# Patient Record
Sex: Female | Born: 1970
Health system: Southern US, Community
[De-identification: ages and names within clinical notes are randomized; demographics above are authoritative.]

## PROBLEM LIST (undated history)

## (undated) DIAGNOSIS — F419 Anxiety disorder, unspecified: Secondary | ICD-10-CM

## (undated) DIAGNOSIS — T1491XA Suicide attempt, initial encounter: Secondary | ICD-10-CM

## (undated) DIAGNOSIS — M1990 Unspecified osteoarthritis, unspecified site: Secondary | ICD-10-CM

## (undated) DIAGNOSIS — G5601 Carpal tunnel syndrome, right upper limb: Secondary | ICD-10-CM

## (undated) DIAGNOSIS — G4730 Sleep apnea, unspecified: Secondary | ICD-10-CM

## (undated) DIAGNOSIS — Z8639 Personal history of other endocrine, nutritional and metabolic disease: Secondary | ICD-10-CM

## (undated) DIAGNOSIS — K219 Gastro-esophageal reflux disease without esophagitis: Secondary | ICD-10-CM

## (undated) DIAGNOSIS — F32A Depression, unspecified: Secondary | ICD-10-CM

## (undated) DIAGNOSIS — E282 Polycystic ovarian syndrome: Secondary | ICD-10-CM

## (undated) DIAGNOSIS — Z973 Presence of spectacles and contact lenses: Secondary | ICD-10-CM

## (undated) DIAGNOSIS — G43909 Migraine, unspecified, not intractable, without status migrainosus: Secondary | ICD-10-CM

## (undated) DIAGNOSIS — Z8719 Personal history of other diseases of the digestive system: Secondary | ICD-10-CM

## (undated) HISTORY — DX: Carpal tunnel syndrome, right upper limb: G56.01

## (undated) HISTORY — PX: NASAL SEPTUM SURGERY: SHX37

## (undated) HISTORY — PX: LAPAROSCOPIC GASTRIC SLEEVE RESECTION: SHX5895

## (undated) HISTORY — PX: CHOLECYSTECTOMY: SHX55

## (undated) HISTORY — DX: Suicide attempt, initial encounter: T14.91XA

---

## 2009-02-16 DIAGNOSIS — T1491XA Suicide attempt, initial encounter: Secondary | ICD-10-CM

## 2009-02-16 HISTORY — DX: Suicide attempt, initial encounter: T14.91XA

## 2014-09-04 ENCOUNTER — Ambulatory Visit (INDEPENDENT_AMBULATORY_CARE_PROVIDER_SITE_OTHER): Payer: BLUE CROSS/BLUE SHIELD | Admitting: Family Medicine

## 2014-09-04 ENCOUNTER — Encounter: Payer: Self-pay | Admitting: Family Medicine

## 2014-09-04 VITALS — BP 127/80 | HR 86 | Ht 61.0 in | Wt 242.0 lb

## 2014-09-04 DIAGNOSIS — G5601 Carpal tunnel syndrome, right upper limb: Secondary | ICD-10-CM | POA: Diagnosis not present

## 2014-09-04 DIAGNOSIS — Z9989 Dependence on other enabling machines and devices: Secondary | ICD-10-CM

## 2014-09-04 DIAGNOSIS — Z6841 Body Mass Index (BMI) 40.0 and over, adult: Secondary | ICD-10-CM

## 2014-09-04 DIAGNOSIS — E282 Polycystic ovarian syndrome: Secondary | ICD-10-CM

## 2014-09-04 DIAGNOSIS — G4733 Obstructive sleep apnea (adult) (pediatric): Secondary | ICD-10-CM | POA: Insufficient documentation

## 2014-09-04 DIAGNOSIS — M25561 Pain in right knee: Secondary | ICD-10-CM

## 2014-09-04 NOTE — Patient Instructions (Addendum)
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    Polycystic Ovarian Syndrome Polycystic ovarian syndrome (PCOS) is a common hormonal disorder among women of reproductive age. Most women with PCOS grow many small cysts on their ovaries. PCOS can cause problems with your periods and make it difficult to get pregnant. It can also cause an increased risk of miscarriage with pregnancy. If left untreated, PCOS can lead to serious health problems, such as diabetes and heart disease. CAUSES The cause of PCOS is not fully understood, but genetics may be a factor. SIGNS AND SYMPTOMS   Infrequent or no menstrual periods.   Inability to get pregnant (infertility) because of not ovulating.   Increased growth of hair on the face, chest, stomach, back, thumbs, thighs, or toes.   Acne, oily skin, or dandruff.   Pelvic pain.   Weight gain or obesity, usually carrying extra weight around the waist.   Type 2 diabetes.   High cholesterol.   High blood pressure.   Female-pattern baldness or thinning hair.   Patches of thickened and dark brown or black skin on the neck, arms, breasts, or thighs.   Tiny excess flaps of skin (skin tags) in the armpits or neck area.   Excessive snoring and having breathing stop at times while asleep (sleep apnea).   Deepening of the voice.   Gestational diabetes when pregnant.  DIAGNOSIS  There is no single test to diagnose PCOS.   Your health care provider will:   Take a medical history.   Perform a pelvic exam.   Have ultrasonography done.   Check your female and female hormone levels.   Measure glucose or sugar levels in the blood.   Do other blood tests.   If you are producing too many female hormones, your health care provider will make sure it is from PCOS. At the physical exam, your health care provider will want to evaluate the areas of increased hair growth. Try to allow natural hair growth for a few days before  the visit.   During a pelvic exam, the ovaries may be enlarged or swollen because of the increased number of small cysts. This can be seen more easily by using vaginal ultrasonography or screening to examine the ovaries and lining of the uterus (endometrium) for cysts. The uterine lining may become thicker if you have not been having a regular period.  TREATMENT  Because there is no cure for PCOS, it needs to be managed to prevent problems. Treatments are based on your symptoms. Treatment is also based on whether you want to have a baby or whether you need contraception.  Treatment may include:   Progesterone hormone to start a menstrual period.   Birth control pills to make you have regular menstrual periods.   Medicines to make you ovulate, if you want to get pregnant.   Medicines to control your insulin.   Medicine to control your blood pressure.   Medicine and diet to control your high cholesterol and triglycerides in your blood.  Medicine to reduce excessive hair growth.  Surgery, making small holes in the ovary, to decrease the amount of female hormone production. This is done through a long, lighted tube (laparoscope) placed into the pelvis through a tiny incision in the lower abdomen.  HOME CARE INSTRUCTIONS  Only take over-the-counter or prescription medicine as directed by your health care provider.  Pay attention to the foods you eat and your activity levels. This can help reduce the  effects of PCOS.  Keep your weight under control.  Eat foods that are low in carbohydrate and high in fiber.  Exercise regularly. SEEK MEDICAL CARE IF:  Your symptoms do not get better with medicine.  You have new symptoms. Document Released: 05/29/2004 Document Revised: 11/23/2012 Document Reviewed: 07/21/2012 [facility] Patient Information 2015 [place], [place]. This information is not intended to replace advice given to you by your health care provider. Make sure you discuss any  questions you have with your health care provider. Polycystic Ovarian Syndrome Polycystic ovarian syndrome (PCOS) is a common hormonal disorder among women of reproductive age. Most women with PCOS grow many small cysts on their ovaries. PCOS can cause problems with your periods and make it difficult to get pregnant. It can also cause an increased risk of miscarriage with pregnancy. If left untreated, PCOS can lead to serious health problems, such as diabetes and heart disease. CAUSES The cause of PCOS is not fully understood, but genetics may be a factor. SIGNS AND SYMPTOMS   Infrequent or no menstrual periods.   Inability to get pregnant (infertility) because of not ovulating.   Increased growth of hair on the face, chest, stomach, back, thumbs, thighs, or toes.   Acne, oily skin, or dandruff.   Pelvic pain.   Weight gain or obesity, usually carrying extra weight around the waist.   Type 2 diabetes.   High cholesterol.   High blood pressure.   Female-pattern baldness or thinning hair.   Patches of thickened and dark brown or black skin on the neck, arms, breasts, or thighs.   Tiny excess flaps of skin (skin tags) in the armpits or neck area.   Excessive snoring and having breathing stop at times while asleep (sleep apnea).   Deepening of the voice.   Gestational diabetes when pregnant.  DIAGNOSIS  There is no single test to diagnose PCOS.   Your health care provider will:   Take a medical history.   Perform a pelvic exam.   Have ultrasonography done.   Check your female and female hormone levels.   Measure glucose or sugar levels in the blood.   Do other blood tests.   If you are producing too many female hormones, your health care provider will make sure it is from PCOS. At the physical exam, your health care provider will want to evaluate the areas of increased hair growth. Try to allow natural hair growth for a few days before the visit.    During a pelvic exam, the ovaries may be enlarged or swollen because of the increased number of small cysts. This can be seen more easily by using vaginal ultrasonography or screening to examine the ovaries and lining of the uterus (endometrium) for cysts. The uterine lining may become thicker if you have not been having a regular period.  TREATMENT  Because there is no cure for PCOS, it needs to be managed to prevent problems. Treatments are based on your symptoms. Treatment is also based on whether you want to have a baby or whether you need contraception.  Treatment may include:   Progesterone hormone to start a menstrual period.   Birth control pills to make you have regular menstrual periods.   Medicines to make you ovulate, if you want to get pregnant.   Medicines to control your insulin.   Medicine to control your blood pressure.   Medicine and diet to control your high cholesterol and triglycerides in your blood.  Medicine to reduce excessive hair  growth.  Surgery, making small holes in the ovary, to decrease the amount of female hormone production. This is done through a long, lighted tube (laparoscope) placed into the pelvis through a tiny incision in the lower abdomen.  HOME CARE INSTRUCTIONS  Only take over-the-counter or prescription medicine as directed by your health care provider.  Pay attention to the foods you eat and your activity levels. This can help reduce the effects of PCOS.  Keep your weight under control.  Eat foods that are low in carbohydrate and high in fiber.  Exercise regularly. SEEK MEDICAL CARE IF:  Your symptoms do not get better with medicine.  You have new symptoms. Document Released: 05/29/2004 Document Revised: 11/23/2012 Document Reviewed: 07/21/2012 [facility] Patient Information 2015 [place], [place]. This information is not intended to replace advice given to you by your health care provider. Make sure you discuss any questions  you have with your health care provider.

## 2014-09-04 NOTE — Progress Notes (Signed)
Subjective:    Patient ID: Tonya Phillips, female    DOB: [birth year], 44 y.o.   MRN: [id number]  HPI 44 year old female comes in today to establish care. Her husband is already a patient here. She has a hx of PCOS and would like to discuss her weight. She says she has the classic symptoms including abnormal hair growth, weight gain and difficulty losing weight and irregular cycles.  She has recently joined the gym and she has scheduled an appointment with a nutritionist coming up.  Has never taken weight loss medications.      Moved her 5 years ago.   Dx with OSA about 15 years ago.  She's just been buying supplies off the Internet and has not been established with a local home health company to get her DME supplies for her CPAP. She says she's not really sure what the machine is set on. But she does use it regularly.  She was referred here by her brother.  Has had carpal tunnel in right wrist x 1 year. 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  If holding a pen or driving or brushing her hand it start to tingle and go numb nad pain radiates into the right forearm.    She's also had some right knee pain. She said she was diagnosed with some arthritis in that knee about a year ago. She notices a lot of discomfort when she first gets up out of the chair. She wants to know what the best thing over-the-counter to take for pain relief.  Review of Systems  Constitutional: Negative for fever, diaphoresis and unexpected weight change.  HENT: Negative for hearing loss, rhinorrhea and tinnitus.   Eyes: Negative for visual disturbance.  Respiratory: Negative for cough and wheezing.   Cardiovascular: Negative for chest pain and palpitations.  Gastrointestinal: Negative for nausea, vomiting, diarrhea and blood in stool.  Genitourinary: Negative for vaginal bleeding, vaginal discharge and difficulty urinating.  Musculoskeletal: Negative for myalgias and arthralgias.  Skin: Negative  for rash.  Neurological: Negative for headaches.  Hematological: Negative for adenopathy. Does not bruise/bleed easily.  Psychiatric/Behavioral: Negative for sleep disturbance and dysphoric mood. The patient is not nervous/anxious.      BP 127/80 mmHg  Pulse 86  Ht 5\' 1"  (1.549 m)  Wt 242 lb (109.77 kg)  BMI 45.75 kg/m2  LMP 08/15/2014 (Exact Date)    No Known Allergies  History reviewed. No pertinent past medical history.  Past Surgical History  Procedure Laterality Date  . Cholecystectomy    . Nasal septum surgery      History   Social History  . Marital Status: Divorced    Spouse Name: N/A  . Number of Children: 0  . Years of Education: college   Occupational History  . 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   - degree in accounting.    Social History Main Topics  . Smoking status: Never Smoker   . Smokeless tobacco: Not on file  . Alcohol Use: 0.0 oz/week    0 Standard drinks or equivalent per week     Comment: occas  . Drug Use: No  . Sexual Activity: Not Currently   Other Topics Concern  . Not on file   Social History Narrative   Pt is a Landscape architect. She has moved to [place] from [place]. Her parents live with her.    Family History  Problem Relation Age of Onset  .  Alcoholism Paternal Grandfather   . Hypertension Brother     Outpatient Encounter Prescriptions as of 09/04/2014  Medication Sig  . venlafaxine XR (EFFEXOR-XR) 150 MG 24 hr capsule    No facility-administered encounter medications on file as of 09/04/2014.           Objective:   Physical Exam  Constitutional: She is oriented to person, place, and time. She appears well-developed and well-nourished.  HENT:  Head: Normocephalic and atraumatic.  Right Ear: External ear normal.  Left Ear: External ear normal.  Nose: Nose normal.  Eyes: Conjunctivae and EOM are normal. Pupils are equal, round, and reactive to light.  Neck: Neck supple. No thyromegaly present.  Cardiovascular:  Normal rate, regular rhythm and normal heart sounds.   Pulmonary/Chest: Effort normal and breath sounds normal. She has no wheezes.  Musculoskeletal: She exhibits no edema.  Lymphadenopathy:    She has no cervical adenopathy.  Neurological: She is alert and oriented to person, place, and time.  Skin: Skin is warm and dry.  Psychiatric: She has a normal mood and affect. Her behavior is normal.          Assessment & Plan:  PCOS - discussed could consider restarting metformin. Also encouraged her to get back on track with diet and exercise. She is Re: Made some great steps forward in joining the gym and has her to schedule a nutrition visit. Also recommended the smart phone application called my fitness pal to help her set some caloric goals. I also gave her an article about using metformin in PCO S and encouraged her to read and think about getting back on the medication. It may even help her lose weight.  Sleep apnea - Will need to get copy of sleep study.  Will schedule her with the home for her supplies. Should she moved here she's just been using the Internet to get her supplies.  Right carpal tunnel - Discussed ergonomics for work statin.  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 Continue to wear night splints. If she's not improving then she can see one of our sports medicine physicians for an injection around the median nerve.  Right knee pain/osteoporosis-I handout on patellofemoral syndrome encouraged her to do some stretches on her on home. Also recommend Aleve over-the-counter for pain relief and/or Tylenol arthritis.  BMI 45- see note under PCOS.

## 2014-09-19 ENCOUNTER — Encounter: Payer: Self-pay | Admitting: Family Medicine

## 2014-09-19 DIAGNOSIS — M1711 Unilateral primary osteoarthritis, right knee: Secondary | ICD-10-CM | POA: Insufficient documentation

## 2014-09-19 DIAGNOSIS — G4730 Sleep apnea, unspecified: Secondary | ICD-10-CM | POA: Insufficient documentation

## 2014-09-19 DIAGNOSIS — J310 Chronic rhinitis: Secondary | ICD-10-CM | POA: Insufficient documentation

## 2014-10-02 ENCOUNTER — Encounter: Payer: Self-pay | Admitting: Family Medicine

## 2014-10-02 ENCOUNTER — Ambulatory Visit (INDEPENDENT_AMBULATORY_CARE_PROVIDER_SITE_OTHER): Payer: BLUE CROSS/BLUE SHIELD | Admitting: Family Medicine

## 2014-10-02 ENCOUNTER — Ambulatory Visit (INDEPENDENT_AMBULATORY_CARE_PROVIDER_SITE_OTHER): Payer: BLUE CROSS/BLUE SHIELD

## 2014-10-02 DIAGNOSIS — M25461 Effusion, right knee: Secondary | ICD-10-CM

## 2014-10-02 DIAGNOSIS — M7731 Calcaneal spur, right foot: Secondary | ICD-10-CM | POA: Diagnosis not present

## 2014-10-02 DIAGNOSIS — M25571 Pain in right ankle and joints of right foot: Secondary | ICD-10-CM | POA: Insufficient documentation

## 2014-10-02 DIAGNOSIS — M79671 Pain in right foot: Secondary | ICD-10-CM

## 2014-10-02 DIAGNOSIS — G5601 Carpal tunnel syndrome, right upper limb: Secondary | ICD-10-CM

## 2014-10-02 DIAGNOSIS — G5600 Carpal tunnel syndrome, unspecified upper limb: Secondary | ICD-10-CM | POA: Insufficient documentation

## 2014-10-02 NOTE — Patient Instructions (Signed)
Thank you for coming in today. Return in a few weeks for combined follow-up foot and carpal tunnel injection. Request a 30 minute visit. Take up to 2 Aleve twice daily for pain. We'll call you with the results from labs and x-rays today Use the postoperative shoe for pain.  Metatarsal Stress Fracture When too much stress is put on the foot, as in running and jumping sports, the center shaft of the bones of the forefoot is very susceptible to stress fractures (break in bone). This is because of repetitive stress on the bone. This injury is more common if osteoporosis is present or if inadequate running shoes are used. Rapid increase in running distances are often the cause. Running distances should be gradually increased to avoid this problem. Shoes should be used which adequately cushion the foot. Shoes should absorb the shocks of the activity.  DIAGNOSIS  Usually the diagnosis is made by history. The foot progressively becomes sorer with activities. X-rays may be negative (show no break) within the first 2 to 3 weeks of the beginning of pain. A later X-ray may show signs of healing bone (callus formation). A bone scan or MRI will usually make the diagnosis earlier. TREATMENT AND HOME CARE INSTRUCTIONS  Treatment may or may not include a cast, removable fracture boot, or walking shoe. Casts are used for short periods of time to prevent muscle atrophy (muscle wasting).  Activities should be stopped until further advised by your caregiver.  Wear shoes with adequate shock absorbing abilities.  Alternative exercise may be undertaken while waiting for healing. These may include bicycling and swimming, or as your caregiver suggests. If you do not have a cast or splint:  You may walk on your injured foot as tolerated or advised.  Do not put any weight on your injured foot for as long as directed by your caregiver. Slowly increase the amount of time you walk on the foot as the pain allows or as  advised.  Use crutches until you can bear weight without pain. A gradual increase in weight bearing may help.  Apply ice to the injury for 15-20 minutes each hour while awake for the first 2 days. Put the ice in a plastic bag and place a towel between the bag of ice and your skin.  Only take over-the-counter or prescription medicines for pain, discomfort, or fever as directed by your caregiver. SEEK IMMEDIATE MEDICAL CARE IF:   Pain is becoming worse rather than better, or if pain is uncontrolled with medications.  You have increased swelling or redness in the foot. MAKE SURE YOU:   Understand these instructions.  Will watch your condition.  Will get help right away if you are not doing well or get worse. Document Released: 01/31/2000 Document Revised: 04/27/2011 Document Reviewed: 11/29/2007 [facility] Patient Information 2015 [place], [place]. This information is not intended to replace advice given to you by your health care provider. Make sure you discuss any questions you have with your health care provider.

## 2014-10-02 NOTE — Progress Notes (Signed)
   Subjective:    I'm seeing this patient as a consultation for:  Dr. Madilyn Fireman  CC: Right foot pain  HPI: Patient notes about one week history of right foot pain. She has pain and swelling in the lateral foot and ankle. She notes that she's increased her activity over the last 3 weeks and attempt to be more healthy. She denies any injury. She's tried ice and over-the-counter medicines for pain which helps some. No radiating pain weakness or numbness fevers or chills.  Additionally patient notes continued right-sided carpal tunnel syndrome. She was diagnosed with a nerve conduction study and outside office in the past. She sees night splints which helps some however she is having some now persistent tingling and pain into the radial 3 fingers and thumb on her right side. This is worse with activity. She denies any weakness.  Past medical history, Surgical history, Family history not pertinant except as noted below, Social history, Allergies, and medications have been entered into the medical record, reviewed, and no changes needed.   Review of Systems: No headache, visual changes, nausea, vomiting, diarrhea, constipation, dizziness, abdominal pain, skin rash, fevers, chills, night sweats, weight loss, swollen lymph nodes, body aches, joint swelling, muscle aches, chest pain, shortness of breath, mood changes, visual or auditory hallucinations.   Objective:    Filed Vitals:   10/02/14 1538  BP: 140/87  Pulse: 84   General: Well Developed, well nourished, and in no acute distress.  Neuro/Psych: Alert and oriented x3, extra-ocular muscles intact, able to move all 4 extremities, sensation grossly intact. Skin: Warm and dry, no rashes noted.  Respiratory: Not using accessory muscles, speaking in full sentences, trachea midline.  Cardiovascular: Pulses palpable, no extremity edema. Abdomen: Does not appear distended. MSK: MSK:  Right leg: Knee nontender normal motion Ankle mildly swollen and  tender lateral malleolus. Stable ligamentous exam. Pain with motion Foot swollen and tender lateral foot especially fifth metatarsal distally. Normal appearance. Normal pulses capillary refill and sensation.  Right hand normal-appearing nontender. Positive Tinel's and Phalen's test. No thenar atrophy. Normal grip strength pulses capillary refill sensation  No results found for this or any previous visit (from the past 24 hour(s)). No results found.  Impression and Recommendations:   This case required medical decision making of moderate complexity.

## 2014-10-02 NOTE — Assessment & Plan Note (Signed)
Stress fracture of the fifth metatarsal versus gout versus OA. X-ray pending of the foot and ankle. Discussed options. Trial of naproxen, postoperative shoe and relative rest. Return in a few weeks if not better.

## 2014-10-02 NOTE — Assessment & Plan Note (Signed)
Patient's symptoms are entirely consistent with carpal tunnel syndrome. She reportedly has a nerve conduction study that confirmed the diagnosis. At this point she has tried conservative measures and still has breakthrough symptoms. She is interested in injection. Patient will schedule in a few weeks for ultrasound guided hydrodissection of the median nerve.

## 2014-10-03 ENCOUNTER — Ambulatory Visit: Payer: BLUE CROSS/BLUE SHIELD | Admitting: Family Medicine

## 2014-10-03 LAB — CBC
HCT: 40.9 % (ref 36.0–46.0)
HEMOGLOBIN: 13.6 g/dL (ref 12.0–15.0)
MCH: 28.8 pg (ref 26.0–34.0)
MCHC: 33.3 g/dL (ref 30.0–36.0)
MCV: 86.5 fL (ref 78.0–100.0)
MPV: 10 fL (ref 8.6–12.4)
Platelets: 259 10*3/uL (ref 150–400)
RBC: 4.73 MIL/uL (ref 3.87–5.11)
RDW: 14.6 % (ref 11.5–15.5)
WBC: 9.2 10*3/uL (ref 4.0–10.5)

## 2014-10-03 LAB — COMPLETE METABOLIC PANEL WITH GFR
ALBUMIN: 4.3 g/dL (ref 3.6–5.1)
ALK PHOS: 63 U/L (ref 33–115)
ALT: 29 U/L (ref 6–29)
AST: 23 U/L (ref 10–30)
BUN: 12 mg/dL (ref 7–25)
CO2: 29 mmol/L (ref 20–31)
Calcium: 9.6 mg/dL (ref 8.6–10.2)
Chloride: 103 mmol/L (ref 98–110)
Creat: 0.73 mg/dL (ref 0.50–1.10)
GFR, Est African American: >89 mL/min (ref >=60)
GFR, Est Non African American: >89 mL/min (ref >=60)
GLUCOSE: 108 mg/dL — AB (ref 65–99)
POTASSIUM: 4.2 mmol/L (ref 3.5–5.3)
Sodium: 144 mmol/L (ref 135–146)
Total Bilirubin: 0.3 mg/dL (ref 0.2–1.2)
Total Protein: 7.6 g/dL (ref 6.1–8.1)

## 2014-10-03 LAB — URIC ACID: URIC ACID, SERUM: 6.1 mg/dL (ref 2.4–7.0)

## 2014-10-04 NOTE — Progress Notes (Signed)
Quick Note:  Xray shows no fracture ______

## 2014-10-15 ENCOUNTER — Ambulatory Visit (INDEPENDENT_AMBULATORY_CARE_PROVIDER_SITE_OTHER): Payer: BLUE CROSS/BLUE SHIELD | Admitting: Family Medicine

## 2014-10-15 ENCOUNTER — Encounter: Payer: Self-pay | Admitting: Family Medicine

## 2014-10-15 VITALS — BP 134/83 | HR 81 | Wt 243.0 lb

## 2014-10-15 DIAGNOSIS — M25571 Pain in right ankle and joints of right foot: Secondary | ICD-10-CM

## 2014-10-15 DIAGNOSIS — G5601 Carpal tunnel syndrome, right upper limb: Secondary | ICD-10-CM | POA: Diagnosis not present

## 2014-10-15 NOTE — Assessment & Plan Note (Signed)
Status post carpal median nerve tunnel hydrodissection. Plan for wrist brace for a few weeks and follow-up if not better. If no improvement would recommend referral to surgery

## 2014-10-15 NOTE — Assessment & Plan Note (Signed)
Patient has pain and evidence of DJD on x-ray. Plan for body helix compression garment. Return in a few weeks if not better. At that time would consider steroid injection versus MRI.

## 2014-10-15 NOTE — Progress Notes (Signed)
Tonya Phillips is a 44 y.o. female who presents to [place]: [facility]  today for follow-up right ankle and right carpal tunnel.  1) right ankle: Patient was seen a few weeks ago foot and ankle pain. X-rays showed some arthritis without fracture. She notes the metatarsal pain has significantly improved however she continues to have significant anterior lateral ankle pain. Pain is worse following activity and is associated with swelling. She's used some ankle braces in the past which helps some. No radiating pain weakness or numbness. No recent injury.  2) wrist: Patient notes continued carpal tunnel syndrome. She has tried multiple conservative methods which have shown to be quite bothersome. She notes persistent numbness and tingling into her first 3 digits of her right hand. We discussed possibility of carpal tunnel median nerve hydrodissection at the last visit and she would like to proceed with this today.   Past Medical History  Diagnosis Date  . Suicide attempt 2011  . Right carpal tunnel syndrome     EMG scanned in   Past Surgical History  Procedure Laterality Date  . Cholecystectomy    . Nasal septum surgery     Social History  Substance Use Topics  . Smoking status: Never Smoker   . Smokeless tobacco: Not on file  . Alcohol Use: 0.0 oz/week    0 Standard drinks or equivalent per week     Comment: occas   family history includes Alcoholism in her paternal grandfather; Hypertension in her brother.  ROS as above Medications: Current Outpatient Prescriptions  Medication Sig Dispense Refill  . venlafaxine XR (EFFEXOR-XR) 150 MG 24 hr capsule      No current facility-administered medications for this visit.   No Known Allergies   Exam:  BP 134/83 mmHg  Pulse 81  Wt 243 lb (110.224 kg) Gen: Well NAD HEENT: EOMI,  MMM Right ankle normal-appearing no significant swelling. Tender palpation anterior lateral aspect of the ankle. Pain with talar tilt  however no significant laxity with talar tilt or anterior drawer. Pulses capillary refill sensation and strength are intact Right hand normal-appearing positive Tinel's and Phalen's test. Normal pulses capillary refill sensation strength and motion. Exts: Brisk capillary refill, warm and well perfused.   Procedure: Real-time Ultrasound Guided right carpal tunnel median nerve hydrodissection  Device: GE Logiq E  Images permanently stored and available for review in the ultrasound unit. Verbal informed consent obtained. Discussed risks and benefits of procedure. Warned about infection bleeding damage to structures skin hypopigmentation, fat atrophy, and nerve injury among others. Patient expresses understanding and agreement Time-out conducted.  Noted no overlying erythema, induration, or other signs of local infection.  Skin prepped in a sterile fashion.  Local anesthesia: Topical Ethyl chloride.  With sterile technique and under real time ultrasound guidance: 40mg  Kenalog and 4 mL lidocaine without epinephrine injected carefully superficial and deep to the median nerve.  Halfway through the procedure the needle was repositioned to allow better approach to the superficial portion of the nerve. Care was taken to avoid the ulnar artery. Completed without difficulty  Advised to call if fevers/chills, erythema, induration, drainage, or persistent bleeding.  Images permanently stored and available for review in the ultrasound unit.  Impression: Technically successful ultrasound guided injection.    No results found for this or any previous visit (from the past 24 hour(s)). No results found.   Please see individual assessment and plan sections.

## 2014-10-15 NOTE — Patient Instructions (Signed)
Thank you for coming in today. Call or go to the ER if you develop a large red swollen joint with extreme pain or oozing puss.  Use the wrist brace for at least 1 week.  Get a BodyHelix ankle brace and use during and for 1 hour after activity.  Return in a few weeks to follow up your ankle.   Carpal Tunnel Syndrome The carpal tunnel is a narrow area located on the palm side of your wrist. The tunnel is formed by the wrist bones and ligaments. Nerves, blood vessels, and tendons pass through the carpal tunnel. Repeated wrist motion or certain diseases may cause swelling within the tunnel. This swelling pinches the main nerve in the wrist (median nerve) and causes the painful hand and arm condition called carpal tunnel syndrome. CAUSES   Repeated wrist motions.  Wrist injuries.  Certain diseases like arthritis, diabetes, alcoholism, hyperthyroidism, and kidney failure.  Obesity.  Pregnancy. SYMPTOMS   A "pins and needles" feeling in your fingers or hand, especially in your thumb, index and middle fingers.  Tingling or numbness in your fingers or hand.  An aching feeling in your entire arm, especially when your wrist and elbow are bent for long periods of time.  Wrist pain that goes up your arm to your shoulder.  Pain that goes down into your palm or fingers.  A weak feeling in your hands. DIAGNOSIS  Your health care provider will take your history and perform a physical exam. An electromyography test may be needed. This test measures electrical signals sent out by your nerves into the muscles. The electrical signals are usually slowed by carpal tunnel syndrome. You may also need X-rays. TREATMENT  Carpal tunnel syndrome may clear up by itself. Your health care provider may recommend a wrist splint or medicine such as a nonsteroidal anti-inflammatory medicine. Cortisone injections may help. Sometimes, surgery may be needed to free the pinched nerve.  HOME CARE INSTRUCTIONS   Take  all medicine as directed by your health care provider. Only take over-the-counter or prescription medicines for pain, discomfort, or fever as directed by your health care provider.  If you were given a splint to keep your wrist from bending, wear it as directed. It is important to wear the splint at night. Wear the splint for as long as you have pain or numbness in your hand, arm, or wrist. This may take 1 to 2 months.  Rest your wrist from any activity that may be causing your pain. If your symptoms are work-related, you may need to talk to your employer about changing to a job that does not require using your wrist.  Put ice on your wrist after long periods of wrist activity.  Put ice in a plastic bag.  Place a towel between your skin and the bag.  Leave the ice on for 15-20 minutes, 03-04 times a day.  Keep all follow-up visits as directed by your health care provider. This includes any orthopedic referrals, physical therapy, and rehabilitation. Any delay in getting necessary care could result in a delay or failure of your condition to heal. SEEK IMMEDIATE MEDICAL CARE IF:   You have new, unexplained symptoms.  Your symptoms get worse and are not helped or controlled with medicines. MAKE SURE YOU:   Understand these instructions.  Will watch your condition.  Will get help right away if you are not doing well or get worse. Document Released: 01/31/2000 Document Revised: 06/19/2013 Document Reviewed: 12/19/2010 ExitCare Patient Information 2015  [employer]. This information is not intended to replace advice given to you by your health care provider. Make sure you discuss any questions you have with your health care provider.

## 2014-11-01 ENCOUNTER — Ambulatory Visit: Payer: BLUE CROSS/BLUE SHIELD | Admitting: Family Medicine

## 2014-11-07 ENCOUNTER — Ambulatory Visit: Payer: BLUE CROSS/BLUE SHIELD | Admitting: Family Medicine

## 2015-02-08 ENCOUNTER — Ambulatory Visit (INDEPENDENT_AMBULATORY_CARE_PROVIDER_SITE_OTHER): Payer: BLUE CROSS/BLUE SHIELD | Admitting: Family Medicine

## 2015-02-08 ENCOUNTER — Encounter: Payer: Self-pay | Admitting: Family Medicine

## 2015-02-08 VITALS — BP 150/79 | HR 80 | Wt 251.0 lb

## 2015-02-08 DIAGNOSIS — M25531 Pain in right wrist: Secondary | ICD-10-CM | POA: Diagnosis not present

## 2015-02-08 DIAGNOSIS — G5601 Carpal tunnel syndrome, right upper limb: Secondary | ICD-10-CM | POA: Diagnosis not present

## 2015-02-08 MED ORDER — NAPROXEN 375 MG PO TABS: 375.0000 mg | ORAL_TABLET | Freq: Two times a day (BID) | ORAL | Status: DC

## 2015-02-08 MED ORDER — DICLOFENAC SODIUM 1 % TD GEL: 2.0000 g | Freq: Four times a day (QID) | TRANSDERMAL | Status: DC

## 2015-02-08 NOTE — Patient Instructions (Signed)
Thank you for coming in today. Use the brace.  Take oral naproxen.  Apply topical neck gel. Return in a few weeks if not better.

## 2015-02-08 NOTE — Assessment & Plan Note (Signed)
Possibly due to tendinitis. Oral and topical NSAIDs bracing and rest. Return if not better.

## 2015-02-08 NOTE — Assessment & Plan Note (Signed)
Possible mild recurrence. Trial of oral and topical NSAIDs with bracing and rest. Return if not improved will consider repeat injection.

## 2015-02-08 NOTE — Progress Notes (Signed)
       Tonya Phillips is a 44 y.o. female who presents to [facility] today for right wrist pain. Patient received a carpal tunnel injection in August 2016. This worked well until recently. She notes a one-week history of mild numbness and tingling to the first 3 digits of her right hand and developed volar wrist pain yesterday. She denies any injury. She has not had much treatment unit. No fevers chills nausea vomiting or diarrhea. She feels well otherwise.   Past Medical History  Diagnosis Date  . Suicide attempt ([place]) 2011  . Right carpal tunnel syndrome     EMG scanned in   Past Surgical History  Procedure Laterality Date  . Cholecystectomy    . Nasal septum surgery     Social History  Substance Use Topics  . Smoking status: Never Smoker   . Smokeless tobacco: Not on file  . Alcohol Use: 0.0 oz/week    0 Standard drinks or equivalent per week     Comment: occas   family history includes Alcoholism in her paternal grandfather; Hypertension in her brother.  ROS as above Medications: Current Outpatient Prescriptions  Medication Sig Dispense Refill  . venlafaxine XR (EFFEXOR-XR) 150 MG 24 hr capsule     . diclofenac sodium (VOLTAREN) 1 % GEL Apply 2 g topically 4 (four) times daily. 100 g 12  . naproxen (NAPROSYN) 375 MG tablet Take 1 tablet (375 mg total) by mouth 2 (two) times daily with a meal. 30 tablet 0   No current facility-administered medications for this visit.   No Known Allergies   Exam:  BP 150/79 mmHg  Pulse 80  Wt 251 lb (113.853 kg) Gen: Well NAD Right wrist is normal appearing and nontender. Normal motion. Mildly positive Tinel's. Normal grip strength pulses capillary refill and sensation.  Nerve conduction study of the right wrist 2009 reviewed  No results found for this or any previous visit (from the past 24 hour(s)). No results found.   Please see  individual assessment and plan sections.

## 2015-02-18 ENCOUNTER — Encounter: Payer: Self-pay | Admitting: Family Medicine

## 2015-02-19 MED ORDER — METFORMIN HCL 500 MG PO TABS: 500.0000 mg | ORAL_TABLET | Freq: Two times a day (BID) | ORAL | Status: DC

## 2015-03-10 ENCOUNTER — Encounter: Payer: Self-pay | Admitting: Family Medicine

## 2015-03-11 ENCOUNTER — Other Ambulatory Visit: Payer: Self-pay

## 2015-03-11 NOTE — Telephone Encounter (Signed)
Patient wants a refill on venlafaxine. Historical medication. She has not been seen since 08/2014. She has 1 tablet left.

## 2015-03-12 ENCOUNTER — Other Ambulatory Visit: Payer: Self-pay | Admitting: Family Medicine

## 2015-03-13 NOTE — Telephone Encounter (Signed)
Ok to fill for 30 days but does need a f/u appt

## 2015-03-13 NOTE — Telephone Encounter (Signed)
Please advise 

## 2015-03-20 MED ORDER — VENLAFAXINE HCL ER 150 MG PO CP24: 150.0000 mg | ORAL_CAPSULE | Freq: Every day | ORAL | Status: DC

## 2015-05-08 ENCOUNTER — Encounter: Payer: Self-pay | Admitting: Family Medicine

## 2015-05-08 ENCOUNTER — Ambulatory Visit (INDEPENDENT_AMBULATORY_CARE_PROVIDER_SITE_OTHER): Payer: BLUE CROSS/BLUE SHIELD | Admitting: Family Medicine

## 2015-05-08 VITALS — BP 123/63 | HR 70 | Wt 240.0 lb

## 2015-05-08 DIAGNOSIS — E282 Polycystic ovarian syndrome: Secondary | ICD-10-CM | POA: Diagnosis not present

## 2015-05-08 LAB — POCT GLYCOSYLATED HEMOGLOBIN (HGB A1C): HEMOGLOBIN A1C: 5.3

## 2015-05-08 MED ORDER — METFORMIN HCL 1000 MG PO TABS: 1000.0000 mg | ORAL_TABLET | Freq: Two times a day (BID) | ORAL | Status: DC

## 2015-05-08 MED ORDER — VENLAFAXINE HCL ER 150 MG PO CP24: 150.0000 mg | ORAL_CAPSULE | Freq: Every day | ORAL | Status: DC

## 2015-05-08 NOTE — Progress Notes (Signed)
   Subjective:    Patient ID: Tonya Phillips, female    DOB: 24-Nov-1970, 45 y.o.   MRN: [id number]  HPI PCOS - She is actually doing really well metformin without any side effects. Back she actually really like to increase her dose. She still having some irregular periods. She has been doing yoga 4-5 days per week. Implants on really starting to work on her diet to lose weight.  Sleep apnea - she did bring in a copy of her sleep study today so she'll we should be able to move forward with getting her new supplies etc.  Mood disorder-she was originally started on Effexor after her divorce and then decided to stay on it after her move. She did actually have a panic attack at work last week which was the first time in a very long time. Right now she would like to continue with medication but says she really like to look at maybe discontinuing sometime this summer.   Review of Systems     Objective:   Physical Exam  Constitutional: She is oriented to person, place, and time. She appears well-developed and well-nourished.  HENT:  Head: Normocephalic and atraumatic.  Cardiovascular: Normal rate, regular rhythm and normal heart sounds.   Pulmonary/Chest: Effort normal and breath sounds normal.  Neurological: She is alert and oriented to person, place, and time.  Skin: Skin is warm and dry.  Psychiatric: She has a normal mood and affect. Her behavior is normal.          Assessment & Plan:  PCOS -  Increase metformin 1000 mg twice a day. Follow-up in 6 months. Hemoglobin A1c looks fantastic today at 5.3. Continue to monitor.  Sleep apnea -  Copy of sleep report given to our referral coordinator    Mood D/O- we'll continue with Effexor. Plan will be to consider weaning in 6 months. We discussed how that can be done more quickly over 2-3 week period or more silly every 2-3 month period. She can certainly consider how she would like to do this in the future.Original sleep study was from 2013 and  they recommended 12 cm of water pressure.  Obesity - dsicused options to work on diet and exercise. Use My Fitness Pal and continue with exercise. She is currently doing yoga.  She need to incorporate aerobic activity as well.

## 2015-05-24 ENCOUNTER — Telehealth: Payer: Self-pay | Admitting: Family Medicine

## 2015-05-24 NOTE — Telephone Encounter (Signed)
Left VM for Pt to return clinic call. We need to know where she had her sleep study completed so we can send the actual results reports to [facility].

## 2015-11-06 ENCOUNTER — Other Ambulatory Visit: Payer: Self-pay | Admitting: Family Medicine

## 2015-11-07 ENCOUNTER — Ambulatory Visit: Payer: BLUE CROSS/BLUE SHIELD | Admitting: Family Medicine

## 2015-11-12 ENCOUNTER — Telehealth: Payer: Self-pay | Admitting: Family Medicine

## 2015-11-12 ENCOUNTER — Other Ambulatory Visit: Payer: Self-pay | Admitting: Family Medicine

## 2015-11-12 MED ORDER — VENLAFAXINE HCL ER 150 MG PO CP24: 150.0000 mg | ORAL_CAPSULE | Freq: Every day | ORAL | 1 refills | Status: DC

## 2015-11-12 NOTE — Telephone Encounter (Signed)
Patient called scheduled an appointment for 10/6/1-7 for a f/up adv she has been working 2nd shift 12 hrs and hard to get off. Patient adv that 1 month of her med was recently sent to [facility] and she needs it to be for 82mnths for insurance purposes and she request to have a nurse call her back about the 31mnth prescription at [phone] for Effexor. Thanks-

## 2015-11-12 NOTE — Telephone Encounter (Signed)
Called pt and lvm informing her that 90 day has been sent and apologized for the confusion this caused.Tonya Phillips [place]

## 2015-11-22 ENCOUNTER — Encounter: Payer: Self-pay | Admitting: Family Medicine

## 2015-11-22 ENCOUNTER — Ambulatory Visit (INDEPENDENT_AMBULATORY_CARE_PROVIDER_SITE_OTHER): Payer: BLUE CROSS/BLUE SHIELD | Admitting: Family Medicine

## 2015-11-22 VITALS — BP 124/61 | HR 76 | Ht 61.0 in | Wt 252.0 lb

## 2015-11-22 DIAGNOSIS — E282 Polycystic ovarian syndrome: Secondary | ICD-10-CM

## 2015-11-22 DIAGNOSIS — G4733 Obstructive sleep apnea (adult) (pediatric): Secondary | ICD-10-CM | POA: Diagnosis not present

## 2015-11-22 DIAGNOSIS — Z9989 Dependence on other enabling machines and devices: Secondary | ICD-10-CM

## 2015-11-22 DIAGNOSIS — E119 Type 2 diabetes mellitus without complications: Secondary | ICD-10-CM

## 2015-11-22 DIAGNOSIS — F418 Other specified anxiety disorders: Secondary | ICD-10-CM | POA: Diagnosis not present

## 2015-11-22 LAB — POCT GLYCOSYLATED HEMOGLOBIN (HGB A1C): Hemoglobin A1C: 6.6

## 2015-11-22 MED ORDER — BLOOD GLUCOSE MONITOR KIT: PACK | 99 refills | Status: DC

## 2015-11-22 MED ORDER — METFORMIN HCL ER (MOD) 1000 MG PO TB24: 1000.0000 mg | ORAL_TABLET | Freq: Every day | ORAL | 0 refills | Status: DC

## 2015-11-22 NOTE — Progress Notes (Addendum)
Subjective:    CC: PCOS  HPI:  PCOS - Currently on metformin. No specific concerns or complaints. She is not currently exercising.   Lab Results  Component Value Date   HGBA1C 5.3 05/08/2015    Depression/Anxiety - Currently on Effexor XR 150 mg daily.  She is doing well overall. She has a new job at her same company.  She now works a night shift but says she is very happy about the job. It's much less stressful. Though she admits that she has not been very consistent with her metformin and had been doing a lot of stress eating until she got this new position.  Obstructive sleep apnea-she still needs a new supplies and a new machine. We did get a copy of her sleep study back in April and faxed over a form to [facility] but she never heard from their office. She is wearing her CPAP  Past medical history, Surgical history, Family history not pertinant except as noted below, Social history, Allergies, and medications have been entered into the medical record, reviewed, and corrections made.   Review of Systems: No fevers, chills, night sweats, weight loss, chest pain, or shortness of breath.   Objective:    General: Well Developed, well nourished, and in no acute distress.  Neuro: Alert and oriented x3, extra-ocular muscles intact, sensation grossly intact.  HEENT: Normocephalic, atraumatic  Skin: Warm and dry, no rashes. Cardiac: Regular rate and rhythm, no murmurs rubs or gallops, no lower extremity edema.  Respiratory: Clear to auscultation bilaterally. Not using accessory muscles, speaking in full sentences.   Impression and Recommendations:    PCOS- Stable. Continue metformin. Unfortunately her A1c jumped up from 5.3-6.6 which now puts her into the diabetes category. Next  New diagnosis of type 2 diabetes-A1c of 6.6 today. We discussed getting back on track with diet and exercise and losing weight. She gained several pounds since I last saw her. I'm also to switch metformin ever to  extended release to see if this helps with consistency in taking her medication. I will see her back in 3 months. We did discuss the possibility of adding Victoza. I showed her a sample of the device and the needle and discussed that it would need to be done daily. Also discussed potential side effects. She will consider it and if her A1c is still elevated when I see her next time we will consider starting the medication.  Depression/Anxiety - mood is actually improved. HQ 9 score of 3 today. Continue current regimen. She says she thinks that when she follows up in January she like to consider weaning it.  Obstructive sleep apnea-we'll try to call [facility] care again. They're not receptive then we will try getting her information to a different home health provider.

## 2015-11-30 ENCOUNTER — Encounter: Payer: Self-pay | Admitting: Family Medicine

## 2015-12-07 ENCOUNTER — Encounter: Payer: Self-pay | Admitting: Family Medicine

## 2015-12-09 NOTE — Telephone Encounter (Signed)
Hi Tonya Phillips,   See note below; "Obstructive sleep apnea-she still needs a new supplies and a new machine. We did get a copy of her sleep study back in April and faxed over a form to [facility] but she never heard from their office. She is wearing her CPAP"  All of this was done in the spring so not sure what happened.  WE may have to start all over and send a new order.  Please let patient know when done.  Thank you.

## 2015-12-12 MED ORDER — AMBULATORY NON FORMULARY MEDICATION: 0 refills | Status: AC

## 2015-12-12 NOTE — Telephone Encounter (Signed)
Left VM for Pt advising of current status.

## 2015-12-12 NOTE — Telephone Encounter (Signed)
Order was originally sent to [facility]. Will send there.

## 2015-12-12 NOTE — Telephone Encounter (Signed)
Please call patient and let her know we are working on it. We sent everything last spring so we didn't realized she was still waiting for supplies.

## 2015-12-20 DIAGNOSIS — G4733 Obstructive sleep apnea (adult) (pediatric): Secondary | ICD-10-CM | POA: Diagnosis not present

## 2016-01-03 ENCOUNTER — Encounter: Payer: Self-pay | Admitting: Family Medicine

## 2016-01-03 MED ORDER — INSULIN PEN NEEDLE 32G X 4 MM MISC: 99 refills | Status: DC

## 2016-01-03 MED ORDER — LIRAGLUTIDE 18 MG/3ML ~~LOC~~ SOPN: 0.6000 mg | PEN_INJECTOR | Freq: Every day | SUBCUTANEOUS | 2 refills | Status: DC

## 2016-01-13 ENCOUNTER — Encounter: Payer: Self-pay | Admitting: Family Medicine

## 2016-01-19 DIAGNOSIS — G4733 Obstructive sleep apnea (adult) (pediatric): Secondary | ICD-10-CM | POA: Diagnosis not present

## 2016-01-30 ENCOUNTER — Encounter: Payer: Self-pay | Admitting: Family Medicine

## 2016-01-30 ENCOUNTER — Ambulatory Visit (INDEPENDENT_AMBULATORY_CARE_PROVIDER_SITE_OTHER): Payer: BLUE CROSS/BLUE SHIELD | Admitting: Family Medicine

## 2016-01-30 DIAGNOSIS — M654 Radial styloid tenosynovitis [de Quervain]: Secondary | ICD-10-CM | POA: Diagnosis not present

## 2016-01-30 NOTE — Progress Notes (Signed)
Tonya Phillips is a 45 y.o. female who presents to [facility] today for right wrist pain.  Right wrist pain, tightness, and mild swelling started 2 weeks ago. Worse with movement. Radiates to both the lower forearm and the base of the thumb. She has tried ice, bracing, voltaren gel, and PT exercises found online. She switched jobs in August and has been doing more typing since, but no recent changes in activity or trauma. She had a shot for right carpal tunnel syndrome last year, and this pain feels different.    Past Medical History:  Diagnosis Date  . Right carpal tunnel syndrome    EMG scanned in  . Suicide attempt 2011   Past Surgical History:  Procedure Laterality Date  . CHOLECYSTECTOMY    . NASAL SEPTUM SURGERY     Social History  Substance Use Topics  . Smoking status: Never Smoker  . Smokeless tobacco: Not on file  . Alcohol use 0.0 oz/week     Comment: occas     ROS:  As above   Medications: Current Outpatient Prescriptions  Medication Sig Dispense Refill  . AMBULATORY NON FORMULARY MEDICATION Medication Name: CPAP, humidifier and supplies set to 12 cm water pressure. . Dx OSA. [facility]. 1 vial 0  . blood glucose meter kit and supplies KIT Dispense based on patient and insurance preference. Use up to once daily as directed. (FOR ICD-9 250.00, 250.01). Please include lancets and strips. 1 each PRN  . diclofenac sodium (VOLTAREN) 1 % GEL Apply 2 g topically 4 (four) times daily. 100 g 12  . Insulin Pen Needle (BD PEN NEEDLE NANO U/F) 32G X 4 MM MISC Use daily with victoza pen. 100 each PRN  . liraglutide (VICTOZA) 18 MG/3ML SOPN Inject 0.1 mLs (0.6 mg total) into the skin daily. Increase to 1.2 mg after one week. 6 mL 2  . metFORMIN (GLUMETZA) 1000 MG (MOD) 24 hr tablet Take 1 tablet (1,000 mg total) by mouth daily with breakfast. 90 tablet 0  . naproxen (NAPROSYN) 375 MG tablet Take 1 tablet (375 mg total) by mouth 2 (two)  times daily with a meal. 30 tablet 0  . venlafaxine XR (EFFEXOR-XR) 150 MG 24 hr capsule Take 1 capsule (150 mg total) by mouth daily. 90 capsule 1   No current facility-administered medications for this visit.    No Known Allergies   Exam:  BP 127/76   Pulse 82   Wt 244 lb (110.7 kg)   BMI 46.10 kg/m  General: Well Developed, well nourished, and in no acute distress.  Neuro/Psych: Alert and oriented x3, extra-ocular muscles intact, able to move all 4 extremities, sensation grossly intact. Skin: Warm and dry, no rashes noted.  Respiratory: Not using accessory muscles, speaking in full sentences, trachea midline.  Cardiovascular: Pulses palpable, no extremity edema. Abdomen: Does not appear distended. MSK:  Right wrist: Normal appearing, no obvious edema Tenderness along EPB and ABL along lower thumb and distal radius  Pain on thumb extension and abduction Finkelstein's test positive Normal neurovascular exam   Ultrasound: First dorsal wrist compartment intact tendon with surrounding hypoechoic fluid consistent with de Quervain's tenosynovitis. Normal bony structures.  Procedure: Real-time Ultrasound Guided Injection of right de Quervain's  Device: GE Logiq E  Images permanently stored and available for review in the ultrasound unit. Verbal informed consent obtained. Discussed risks and benefits of procedure. Warned about infection bleeding damage to structures skin hypopigmentation and fat atrophy among others. Patient  expresses understanding and agreement Time-out conducted.  Noted no overlying erythema, induration, or other signs of local infection.  Skin prepped in a sterile fashion.  Local anesthesia: Topical Ethyl chloride.  With sterile technique and under real time ultrasound guidance: 40 mg of Kenalog and 2 mL of Marcaine injected easily.  Completed without difficulty  Pain immediately resolved suggesting accurate placement of the medication.  Advised to  call if fevers/chills, erythema, induration, drainage, or persistent bleeding.  Images permanently stored and available for review in the ultrasound unit.  Impression: Technically successful ultrasound guided injection.     No results found for this or any previous visit (from the past 48 hour(s)). No results found.  Assessment and Plan: 45 y.o. female with De Quervain's tenosynovitis of the right wrist. Injected first dorsal wrist compartment today. Use brace and continue PT exercises. Follow up in 1 month.    No orders of the defined types were placed in this encounter.   Discussed warning signs or symptoms. Please see discharge instructions. Patient expresses understanding.

## 2016-01-30 NOTE — Patient Instructions (Signed)
Thank you for coming in today. Call or go to the ER if you develop a large red swollen joint with extreme pain or oozing puss.  Return in 4 weeks or sooner if needed Use the brace as needed  Alfonse Ras Tenosynovitis Introduction Tendons attach muscles to bones. They also help with joint movements. When tendons become irritated or swollen, it is called tendinitis. The extensor pollicis brevis (EPB) tendon connects the EPB muscle to a bone that is near the base of the thumb. The EPB muscle helps to straighten and extend the thumb. De Quervain tenosynovitis is a condition in which the EPB tendon lining (sheath) becomes irritated, thickened, and swollen. This condition is sometimes called stenosing tenosynovitis. This condition causes pain on the thumb side of the back of the wrist. What are the causes? Causes of this condition include:  Activities that repeatedly cause your thumb and wrist to extend.  A sudden increase in activity or change in activity that affects your wrist. What increases the risk? This condition is more likely to develop in:  Females.  People who have diabetes.  Women who have recently given birth.  People who are over 7 years of age.  People who do activities that involve repeated hand and wrist motions, such as tennis, racquetball, volleyball, gardening, and taking care of children.  People who do heavy labor.  People who have poor wrist strength and flexibility.  People who do not warm up properly before activities. What are the signs or symptoms? Symptoms of this condition include:  Pain or tenderness over the thumb side of the back of the wrist when your thumb and wrist are not moving.  Pain that gets worse when you straighten your thumb or extend your thumb or wrist.  Pain when the injured area is touched.  Locking or catching of the thumb joint while you bend and straighten your thumb.  Decreased thumb motion due to pain.  Swelling over the  affected area. How is this diagnosed? This condition is diagnosed with a medical history and physical exam. Your health care provider will ask for details about your injury and ask about your symptoms. How is this treated? Treatment may include the use of icing and medicines to reduce pain and swelling. You may also be advised to wear a splint or brace to limit your thumb and wrist motion. In less severe cases, treatment may also include working with a physical therapist to strengthen your wrist and calm the irritation around your EPB tendon sheath. In severe cases, surgery may be needed. Follow these instructions at home: If you have a splint or brace:  Wear it as told by your health care provider. Remove it only as told by your health care provider.  Loosen the splint or brace if your fingers become numb and tingle, or if they turn cold and blue.  Keep the splint or brace clean and dry. Managing pain, stiffness, and swelling  If directed, apply ice to the injured area.  Put ice in a plastic bag.  Place a towel between your skin and the bag.  Leave the ice on for 20 minutes, 2-3 times per day.  Move your fingers often to avoid stiffness and to lessen swelling.  Raise (elevate) the injured area above the level of your heart while you are sitting or lying down. General instructions  Return to your normal activities as told by your health care provider. Ask your health care provider what activities are safe for you.  Take over-the-counter and prescription medicines only as told by your health care provider.  Keep all follow-up visits as told by your health care provider. This is important.  Do not drive or operate heavy machinery while taking prescription pain medicine. Contact a health care provider if:  Your pain, tenderness, or swelling gets worse, even if you have had treatment.  You have numbness or tingling in your wrist, hand, or fingers on the injured side. This  information is not intended to replace advice given to you by your health care provider. Make sure you discuss any questions you have with your health care provider. Document Released: 02/02/2005 Document Revised: 07/11/2015 Document Reviewed: 04/10/2014  2017 Elsevier

## 2016-02-17 DIAGNOSIS — K760 Fatty (change of) liver, not elsewhere classified: Secondary | ICD-10-CM

## 2016-02-17 HISTORY — DX: Fatty (change of) liver, not elsewhere classified: K76.0

## 2016-02-18 ENCOUNTER — Other Ambulatory Visit: Payer: Self-pay | Admitting: Family Medicine

## 2016-02-19 DIAGNOSIS — G4733 Obstructive sleep apnea (adult) (pediatric): Secondary | ICD-10-CM | POA: Diagnosis not present

## 2016-02-20 DIAGNOSIS — G4733 Obstructive sleep apnea (adult) (pediatric): Secondary | ICD-10-CM | POA: Diagnosis not present

## 2016-02-24 ENCOUNTER — Encounter: Payer: Self-pay | Admitting: Family Medicine

## 2016-02-24 ENCOUNTER — Ambulatory Visit (INDEPENDENT_AMBULATORY_CARE_PROVIDER_SITE_OTHER): Payer: BLUE CROSS/BLUE SHIELD | Admitting: Family Medicine

## 2016-02-24 VITALS — BP 134/76 | HR 82 | Ht 61.0 in | Wt 238.0 lb

## 2016-02-24 DIAGNOSIS — Z23 Encounter for immunization: Secondary | ICD-10-CM

## 2016-02-24 DIAGNOSIS — E119 Type 2 diabetes mellitus without complications: Secondary | ICD-10-CM

## 2016-02-24 DIAGNOSIS — Z6841 Body Mass Index (BMI) 40.0 and over, adult: Secondary | ICD-10-CM

## 2016-02-24 LAB — POCT GLYCOSYLATED HEMOGLOBIN (HGB A1C): HEMOGLOBIN A1C: 5.6

## 2016-02-24 LAB — POCT UA - MICROALBUMIN
CREATININE, POC: 300 mg/dL
MICROALBUMIN (UR) POC: 30 mg/L

## 2016-02-24 NOTE — Addendum Note (Signed)
Addended by: Teddy Spike on: 02/24/2016 11:45 AM   Modules accepted: Orders

## 2016-02-24 NOTE — Progress Notes (Signed)
Subjective:    CC: DM  HPI: Diabetes - no hypoglycemic events. No wounds or sores that are not healing well. No increased thirst or urination. Checking glucose at home. Taking medications as prescribed without any side effects.  Plus she's lost about 17 pounds which is great. She is doing really well on the Victoza and has not had any nausea except for the first 2 weeks that she started it.  Obesity/BMI of 44-she is doing well and has lost a significant amount of weight. She has not been exercising but says she and her husband plan to start and have made some strategies to do so.  Past medical history, Surgical history, Family history not pertinant except as noted below, Social history, Allergies, and medications have been entered into the medical record, reviewed, and corrections made.   Review of Systems: No fevers, chills, night sweats, weight loss, chest pain, or shortness of breath.   Objective:    General: Well Developed, well nourished, and in no acute distress.  Neuro: Alert and oriented x3, extra-ocular muscles intact, sensation grossly intact.  HEENT: Normocephalic, atraumatic  Skin: Warm and dry, no rashes. Cardiac: Regular rate and rhythm, no murmurs rubs or gallops, no lower extremity edema.  Respiratory: Clear to auscultation bilaterally. Not using accessory muscles, speaking in full sentences.   Impression and Recommendations:    DM- Well-controlled. Hemoglobin A1c down to 5.6 which is absolutely fantastic. Continue current regimen. Due for CMP and lipid panel. She also requests to have her thyroid checked. She says she plans to schedule her eye exam in the next month or 2 now she has new insurance.  Obesity/BMI of 44-she's made great progress. Continue work on [employer] exercise and exercise. Hopefully she'll be more into an exercise routine when I see her back. We'll continue with current dose of Victoza.  Tdap have given today. Also discussed the need for pneumonia  23 vaccine but she wants to think about that one. She declines the flu vaccine today.

## 2016-02-24 NOTE — Addendum Note (Signed)
Addended by: Beatrice Lecher D on: 02/24/2016 11:32 AM   Modules accepted: Orders

## 2016-02-27 ENCOUNTER — Ambulatory Visit: Payer: BLUE CROSS/BLUE SHIELD | Admitting: Family Medicine

## 2016-03-02 ENCOUNTER — Encounter: Payer: Self-pay | Admitting: Family Medicine

## 2016-03-02 ENCOUNTER — Other Ambulatory Visit: Payer: Self-pay

## 2016-03-02 MED ORDER — LIRAGLUTIDE 18 MG/3ML ~~LOC~~ SOPN: 0.6000 mg | PEN_INJECTOR | Freq: Every day | SUBCUTANEOUS | 2 refills | Status: DC

## 2016-03-06 ENCOUNTER — Other Ambulatory Visit: Payer: Self-pay | Admitting: *Deleted

## 2016-03-23 DIAGNOSIS — G4733 Obstructive sleep apnea (adult) (pediatric): Secondary | ICD-10-CM | POA: Diagnosis not present

## 2016-04-13 DIAGNOSIS — E119 Type 2 diabetes mellitus without complications: Secondary | ICD-10-CM | POA: Diagnosis not present

## 2016-04-13 DIAGNOSIS — Z6841 Body Mass Index (BMI) 40.0 and over, adult: Secondary | ICD-10-CM | POA: Diagnosis not present

## 2016-04-14 ENCOUNTER — Other Ambulatory Visit: Payer: Self-pay | Admitting: Family Medicine

## 2016-04-14 ENCOUNTER — Encounter: Payer: Self-pay | Admitting: Family Medicine

## 2016-04-14 DIAGNOSIS — R945 Abnormal results of liver function studies: Principal | ICD-10-CM

## 2016-04-14 DIAGNOSIS — R7989 Other specified abnormal findings of blood chemistry: Secondary | ICD-10-CM

## 2016-04-14 LAB — COMPLETE METABOLIC PANEL WITH GFR
ALBUMIN: 4.2 g/dL (ref 3.6–5.1)
ALK PHOS: 56 U/L (ref 33–115)
ALT: 45 U/L — ABNORMAL HIGH (ref 6–29)
AST: 33 U/L — AB (ref 10–30)
BILIRUBIN TOTAL: 0.6 mg/dL (ref 0.2–1.2)
BUN: 12 mg/dL (ref 7–25)
CALCIUM: 9.3 mg/dL (ref 8.6–10.2)
CO2: 26 mmol/L (ref 20–31)
CREATININE: 0.74 mg/dL (ref 0.50–1.10)
Chloride: 104 mmol/L (ref 98–110)
GFR, Est African American: >89 mL/min (ref >=60)
GFR, Est Non African American: >89 mL/min (ref >=60)
GLUCOSE: 85 mg/dL (ref 65–99)
POTASSIUM: 4.2 mmol/L (ref 3.5–5.3)
SODIUM: 139 mmol/L (ref 135–146)
TOTAL PROTEIN: 7.2 g/dL (ref 6.1–8.1)

## 2016-04-14 LAB — LIPID PANEL
CHOLESTEROL: 186 mg/dL (ref <200)
HDL: 38 mg/dL — ABNORMAL LOW (ref >50)
LDL Cholesterol: 117 mg/dL — ABNORMAL HIGH (ref <100)
TRIGLYCERIDES: 153 mg/dL — AB (ref <150)
Total CHOL/HDL Ratio: 4.9 Ratio (ref <5.0)
VLDL: 31 mg/dL — AB (ref <30)

## 2016-04-14 LAB — TSH: TSH: 1.51 mIU/L

## 2016-04-17 ENCOUNTER — Other Ambulatory Visit: Payer: Self-pay | Admitting: Family Medicine

## 2016-05-08 ENCOUNTER — Other Ambulatory Visit: Payer: Self-pay | Admitting: Family Medicine

## 2016-06-03 ENCOUNTER — Other Ambulatory Visit: Payer: Self-pay | Admitting: Family Medicine

## 2016-06-23 ENCOUNTER — Ambulatory Visit: Payer: BLUE CROSS/BLUE SHIELD | Admitting: Family Medicine

## 2016-06-23 NOTE — Progress Notes (Deleted)
Subjective:    CC: DM  HPI:  Diabetes - no hypoglycemic events. No wounds or sores that are not healing well. No increased thirst or urination. Checking glucose at home. Taking medications as prescribed without any side effects.   Past medical history, Surgical history, Family history not pertinant except as noted below, Social history, Allergies, and medications have been entered into the medical record, reviewed, and corrections made.   Review of Systems: No fevers, chills, night sweats, weight loss, chest pain, or shortness of breath.   Objective:    General: Well Developed, well nourished, and in no acute distress.  Neuro: Alert and oriented x3, extra-ocular muscles intact, sensation grossly intact.  HEENT: Normocephalic, atraumatic  Skin: Warm and dry, no rashes. Cardiac: Regular rate and rhythm, no murmurs rubs or gallops, no lower extremity edema.  Respiratory: Clear to auscultation bilaterally. Not using accessory muscles, speaking in full sentences.   Impression and Recommendations:    DM-

## 2016-07-25 ENCOUNTER — Encounter: Payer: Self-pay | Admitting: Family Medicine

## 2016-07-25 DIAGNOSIS — G4730 Sleep apnea, unspecified: Secondary | ICD-10-CM

## 2016-07-25 DIAGNOSIS — E119 Type 2 diabetes mellitus without complications: Secondary | ICD-10-CM

## 2016-07-25 DIAGNOSIS — E282 Polycystic ovarian syndrome: Secondary | ICD-10-CM

## 2016-07-29 ENCOUNTER — Telehealth: Payer: Self-pay | Admitting: Family Medicine

## 2016-07-29 NOTE — Telephone Encounter (Signed)
Patient stated she was last seen for her DM eye exam at [facility] in [place] and they are permanently closed and I have no way of getting her records. Thanks

## 2016-08-12 DIAGNOSIS — R5382 Chronic fatigue, unspecified: Secondary | ICD-10-CM | POA: Diagnosis not present

## 2016-08-12 DIAGNOSIS — E559 Vitamin D deficiency, unspecified: Secondary | ICD-10-CM | POA: Diagnosis not present

## 2016-08-12 DIAGNOSIS — E8881 Metabolic syndrome: Secondary | ICD-10-CM | POA: Diagnosis not present

## 2016-08-20 ENCOUNTER — Ambulatory Visit: Payer: BLUE CROSS/BLUE SHIELD | Admitting: Family Medicine

## 2016-08-21 ENCOUNTER — Ambulatory Visit (INDEPENDENT_AMBULATORY_CARE_PROVIDER_SITE_OTHER): Payer: BLUE CROSS/BLUE SHIELD

## 2016-08-21 ENCOUNTER — Ambulatory Visit (INDEPENDENT_AMBULATORY_CARE_PROVIDER_SITE_OTHER): Payer: BLUE CROSS/BLUE SHIELD | Admitting: Family Medicine

## 2016-08-21 VITALS — BP 140/71 | HR 88 | Temp 97.7°F | Wt 252.0 lb

## 2016-08-21 DIAGNOSIS — M25531 Pain in right wrist: Secondary | ICD-10-CM

## 2016-08-21 DIAGNOSIS — R200 Anesthesia of skin: Secondary | ICD-10-CM | POA: Diagnosis not present

## 2016-08-21 DIAGNOSIS — R202 Paresthesia of skin: Secondary | ICD-10-CM | POA: Diagnosis not present

## 2016-08-21 MED ORDER — DICLOFENAC SODIUM 1 % TD GEL: 2.0000 g | Freq: Four times a day (QID) | TRANSDERMAL | 12 refills | Status: DC

## 2016-08-21 NOTE — Patient Instructions (Signed)
Thank you for coming in today. Get an xray today.  Attend Hand PT.  Use the gel.  You should hear about the nerve study within 1 week.  Let me know if you do not hear anything.   Lets recheck in 4-6 weeks about your wrist and hand.

## 2016-08-21 NOTE — Progress Notes (Signed)
Tonya Phillips is a 46 y.o. female who presents to [facility] today for right wrist pain.  Patient has pain and tingling in her right wrist. She's been seen several times in the last several years for this issue. She has a history of carpal tunnel syndrome and de Quervain's tenosynovitis both of which were injected in 2016 and 2017 respectively.   She notes pain across the dorsal aspect of her wrist volar rest and numbness of the third fourth and fifth digits. She has been using her wrist brace which helps a little. She denies any fevers or chills or repeat injury.  Past Medical History:  Diagnosis Date  . Right carpal tunnel syndrome    EMG scanned in  . Suicide attempt 2011   Past Surgical History:  Procedure Laterality Date  . CHOLECYSTECTOMY    . NASAL SEPTUM SURGERY     Social History  Substance Use Topics  . Smoking status: Never Smoker  . Smokeless tobacco: Not on file  . Alcohol use 0.0 oz/week     Comment: occas     ROS:  As above   Medications: Current Outpatient Prescriptions  Medication Sig Dispense Refill  . AMBULATORY NON FORMULARY MEDICATION Medication Name: CPAP, humidifier and supplies set to 12 cm water pressure. . Dx OSA. [employer]. 1 vial 0  . BD PEN NEEDLE NANO U/F 32G X 4 MM MISC USE DAILY WITH VICTOZA PEN. 100 each 11  . blood glucose meter kit and supplies KIT Dispense based on patient and insurance preference. Use up to once daily as directed. (FOR ICD-9 250.00, 250.01). Please include lancets and strips. 1 each PRN  . diclofenac sodium (VOLTAREN) 1 % GEL Apply 2 g topically 4 (four) times daily. 100 g 12  . metFORMIN (GLUMETZA) 1000 MG (MOD) 24 hr tablet TAKE 1 TABLET (1,000 MG TOTAL) BY MOUTH DAILY WITH BREAKFAST. 90 tablet 0  . naproxen (NAPROSYN) 375 MG tablet Take 1 tablet (375 mg total) by mouth 2 (two) times daily with a meal. 30 tablet 0  . venlafaxine XR (EFFEXOR-XR) 150 MG 24 hr capsule TAKE 1  CAPSULE (150 MG TOTAL) BY MOUTH DAILY. 90 capsule 1  . VICTOZA 18 MG/3ML SOPN INJECT 0.1 MLS (0.6 MG TOTAL) INTO THE SKIN DAILY. INCREASE TO 1.2 MG AFTER ONE WEEK. 3 pen 2   No current facility-administered medications for this visit.    No Known Allergies   Exam:  BP 140/71 (BP Location: Left Arm, Patient Position: Sitting, Cuff Size: Large)   Pulse 88   Temp 97.7 F (36.5 C) (Oral)   Wt 252 lb (114.3 kg)   SpO2 97%   BMI 47.61 kg/m  General: Well Developed, well nourished, and in no acute distress.  Neuro/Psych: Alert and oriented x3, extra-ocular muscles intact, able to move all 4 extremities, sensation grossly intact. Skin: Warm and dry, no rashes noted.  Respiratory: Not using accessory muscles, speaking in full sentences, trachea midline.  Cardiovascular: Pulses palpable, no extremity edema. Abdomen: Does not appear distended. MSK: Right wrist normal-appearing. Tender to palpation dorsal wrist. Range of motion diminished in extension and flexion. Pulses capillary refill sensation and grip strength are intact. Negative Tinel's over the carpal tunnel Guyon's [place] cubital tunnel in the elbow. Patient lacks wrist range of motion sufficient for Phalen's test. Pain with resisted finger extension and fell to the dorsal wrist. The fourth dorsal wrist compartment.    No results found for this or any previous visit (  from the past 48 hour(s)). Dg Wrist Complete Right  Result Date: 08/21/2016 CLINICAL DATA:  Right wrist pain x several weeks. No injury. EXAM: RIGHT WRIST - COMPLETE 3+ VIEW COMPARISON:  None. FINDINGS: There is no evidence of fracture or dislocation. There is no evidence of arthropathy or other focal bone abnormality. Soft tissues are unremarkable. IMPRESSION: Negative. Electronically Signed   By: Lajean Manes M.D.   On: 08/21/2016 11:36      Assessment and Plan: 46 y.o. female with  Wrist pain and paresthesias.  The distribution of paresthesias do not fit  either involvement of the median nerve or ulnar nerve. Plan for nerve conduction study to further elucidate the cause of symptoms.  Additionally wrist pain is not consistent with either carpal tunnel syndrome or de Quervain's tenosynovitis. This more sitting for wrist arthritis or tendinopathy. X-rays unremarkable for arthritis however. Plan for referral to hand therapy is a think she'll get benefit with this.  Recheck in a month.    Orders Placed This Encounter  Procedures  . DG Wrist Complete Right    Standing Status:   Future    Number of Occurrences:   1    Standing Expiration Date:   10/22/2017    Order Specific Question:   Reason for Exam (SYMPTOM  OR DIAGNOSIS REQUIRED)    Answer:   eval pain rt wrist    Order Specific Question:   Is patient pregnant?    Answer:   No    Order Specific Question:   Preferred imaging location?    Answer:   [facility]    Order Specific Question:   Radiology Contrast Protocol - do NOT remove file path    Answer:   [email or web address]  . Ambulatory referral to Physical Therapy    Referral Priority:   Routine    Referral Type:   Physical Medicine    Referral Reason:   Specialty Services Required    Requested Specialty:   Physical Therapy    Number of Visits Requested:   1  . NCV with EMG(electromyography)    Standing Status:   Future    Standing Expiration Date:   08/21/2017    Scheduling Instructions:     Dr Kellie Moor in [place]    Order Specific Question:   Where should this test be performed?    Answer:   other   Meds ordered this encounter  Medications  . diclofenac sodium (VOLTAREN) 1 % GEL    Sig: Apply 2 g topically 4 (four) times daily.    Dispense:  100 g    Refill:  12    Discussed warning signs or symptoms. Please see discharge instructions. Patient expresses understanding.

## 2016-08-27 DIAGNOSIS — Z713 Dietary counseling and surveillance: Secondary | ICD-10-CM | POA: Diagnosis not present

## 2016-09-04 ENCOUNTER — Encounter: Payer: Self-pay | Admitting: Family Medicine

## 2016-09-04 MED ORDER — VENLAFAXINE HCL ER 75 MG PO CP24: 75.0000 mg | ORAL_CAPSULE | Freq: Every day | ORAL | 1 refills | Status: DC

## 2016-09-08 ENCOUNTER — Telehealth: Payer: Self-pay | Admitting: Family Medicine

## 2016-09-08 NOTE — Telephone Encounter (Signed)
vm is full -EH/RMA

## 2016-09-08 NOTE — Telephone Encounter (Signed)
Please call patient: Because of her diagnosis of diabetes current guidelines would recommend that she be on a statin which is a cholesterol lowering drug at bedtime even if her chlosterol numbers aren't that bad. It reduces risk overall for heart attack and stroke and I would strongly encourage her to consider starting one of these medicines. Also can you please find out where she gets her Pap smear. She has a GYN and see if we can contact them for recent copy.

## 2016-09-09 ENCOUNTER — Other Ambulatory Visit: Payer: Self-pay | Admitting: Family Medicine

## 2016-09-09 NOTE — Telephone Encounter (Signed)
Called pt vm full  ?

## 2016-09-10 ENCOUNTER — Encounter: Payer: Self-pay | Admitting: *Deleted

## 2016-09-10 ENCOUNTER — Encounter: Payer: Self-pay | Admitting: Family Medicine

## 2016-09-10 NOTE — Telephone Encounter (Signed)
Letter mailed

## 2016-09-11 NOTE — Telephone Encounter (Signed)
She needs an appt.  Hasn't been seen since Jan. I am not sure what she is asking about the statin?

## 2016-09-14 DIAGNOSIS — G5600 Carpal tunnel syndrome, unspecified upper limb: Secondary | ICD-10-CM | POA: Diagnosis not present

## 2016-09-22 ENCOUNTER — Ambulatory Visit (INDEPENDENT_AMBULATORY_CARE_PROVIDER_SITE_OTHER): Payer: BLUE CROSS/BLUE SHIELD | Admitting: Family Medicine

## 2016-09-22 VITALS — BP 130/84 | HR 86 | Ht 61.0 in | Wt 243.0 lb

## 2016-09-22 DIAGNOSIS — E782 Mixed hyperlipidemia: Secondary | ICD-10-CM | POA: Diagnosis not present

## 2016-09-22 DIAGNOSIS — E785 Hyperlipidemia, unspecified: Secondary | ICD-10-CM | POA: Insufficient documentation

## 2016-09-22 DIAGNOSIS — G5601 Carpal tunnel syndrome, right upper limb: Secondary | ICD-10-CM

## 2016-09-22 MED ORDER — ATORVASTATIN CALCIUM 20 MG PO TABS: 20.0000 mg | ORAL_TABLET | Freq: Every day | ORAL | 0 refills | Status: DC

## 2016-09-22 MED ORDER — METFORMIN HCL ER (MOD) 1000 MG PO TB24: 1000.0000 mg | ORAL_TABLET | Freq: Every day | ORAL | 0 refills | Status: DC

## 2016-09-22 NOTE — Progress Notes (Signed)
Tonya Phillips is a 46 y.o. female who presents to [facility] today for right wrist symptoms. Patient has a history of right carpal tunnel syndrome and has had worsening symptoms recently. She had a median nerve hydrodissection late August 2016 that lasted until recently. She notes returning symptoms and had a recent nerve conduction study that reportedly showed carpal tunnel syndrome. She is interested in repeat injection today as conservative measures including bracing has been insufficient.  Additionally she notes that she has been asked to follow-up with her primary care provider in the near future to discuss statin therapy for hyperlipidemia. She'll like to start medicine today in follow-up with PCP in about a month if possible.   Past Medical History:  Diagnosis Date  . Right carpal tunnel syndrome    EMG scanned in  . Suicide attempt 2011   Past Surgical History:  Procedure Laterality Date  . CHOLECYSTECTOMY    . NASAL SEPTUM SURGERY     Social History  Substance Use Topics  . Smoking status: Never Smoker  . Smokeless tobacco: Not on file  . Alcohol use 0.0 oz/week     Comment: occas     ROS:  As above   Medications: Current Outpatient Prescriptions  Medication Sig Dispense Refill  . AMBULATORY NON FORMULARY MEDICATION Medication Name: CPAP, humidifier and supplies set to 12 cm water pressure. . Dx OSA. [employer]. 1 vial 0  . BD PEN NEEDLE NANO U/F 32G X 4 MM MISC USE DAILY WITH VICTOZA PEN. 100 each 11  . blood glucose meter kit and supplies KIT Dispense based on patient and insurance preference. Use up to once daily as directed. (FOR ICD-9 250.00, 250.01). Please include lancets and strips. 1 each PRN  . diclofenac sodium (VOLTAREN) 1 % GEL Apply 2 g topically 4 (four) times daily. 100 g 12  . metFORMIN (GLUMETZA) 1000 MG (MOD) 24 hr tablet Take 1 tablet (1,000 mg total) by mouth daily with breakfast. 90 tablet 0  .  venlafaxine XR (EFFEXOR-XR) 75 MG 24 hr capsule Take 1 capsule (75 mg total) by mouth daily. 30 capsule 1  . VICTOZA 18 MG/3ML SOPN INJECT 0.1 MLS (0.6 MG TOTAL) INTO THE SKIN DAILY. INCREASE TO 1.2 MG AFTER ONE WEEK. 6 pen 2  . atorvastatin (LIPITOR) 20 MG tablet Take 1 tablet (20 mg total) by mouth daily. 90 tablet 0   No current facility-administered medications for this visit.    No Known Allergies   Exam:  BP 130/84   Pulse 86   Ht 5' 1" (1.549 m)   Wt 243 lb (110.2 kg)   BMI 45.91 kg/m  General: Well Developed, well nourished, and in no acute distress.  Neuro/Psych: Alert and oriented x3, extra-ocular muscles intact, able to move all 4 extremities, sensation grossly intact. Skin: Warm and dry, no rashes noted.  Respiratory: Not using accessory muscles, speaking in full sentences, trachea midline.  Cardiovascular: Pulses palpable, no extremity edema. Abdomen: Does not appear distended. MSK: Right wrist normal-appearing nontender positive Tinel's and Phalen's test.  Procedure: Real-time Ultrasound Guided right carpal tunnel median nerve hydrodissection  Device: GE Logiq E  Images permanently stored and available for review in the ultrasound unit. Verbal informed consent obtained. Discussed risks and benefits of procedure. Warned about infection bleeding damage to structures skin hypopigmentation, fat atrophy, and nerve injury among others. Patient expresses understanding and agreement Time-out conducted.  Noted no overlying erythema, induration, or other signs of local infection.  Skin prepped in a sterile fashion.  Local anesthesia: Topical Ethyl chloride.  With sterile technique and under real time ultrasound guidance: 36m Kenalog and 4 mL lidocaine without epinephrine injected carefully superficial and deep to the median nerve.  Care was taken to avoid the ulnar artery. Completed without difficulty  Advised to call if fevers/chills, erythema, induration, drainage,  or persistent bleeding.  Images permanently stored and available for review in the ultrasound unit.  Impression: Technically successful ultrasound guided injection.  Lab Results  Component Value Date   CHOL 186 04/13/2016   HDL 38 (L) 04/13/2016   LDLCALC 117 (H) 04/13/2016   TRIG 153 (H) 04/13/2016   CHOLHDL 4.9 04/13/2016     No results found for this or any previous visit (from the past 48 hour(s)). No results found.    Assessment and Plan: 46y.o. female with  Right carpal tunnel syndrome. Treated with injection today. Continue bracing. Recheck with me as needed.  Hyperlipidemia: We'll start Lipitor and follow-up with PCP in about a month.    No orders of the defined types were placed in this encounter.  Meds ordered this encounter  Medications  . atorvastatin (LIPITOR) 20 MG tablet    Sig: Take 1 tablet (20 mg total) by mouth daily.    Dispense:  90 tablet    Refill:  0  . metFORMIN (GLUMETZA) 1000 MG (MOD) 24 hr tablet    Sig: Take 1 tablet (1,000 mg total) by mouth daily with breakfast.    Dispense:  90 tablet    Refill:  0    Discussed warning signs or symptoms. Please see discharge instructions. Patient expresses understanding.

## 2016-09-22 NOTE — Patient Instructions (Addendum)
Thank you for coming in today. Continue brace for carpal tunnel syndrome.  Call or go to the ER if you develop a large red swollen joint with extreme pain or oozing puss.   I will let you know my read of the results of the nerve study.   Recheck with me as needed.  Attend Hand PT.   Follow up with Dr Madilyn Fireman in 1 month for diabetes and cholesterol.  Start lipitor.   I think [facility] has the hand PT in [place].   [facility] [address], [place], [place] [address] Contact us [phone] office [phone] fax

## 2016-09-23 ENCOUNTER — Encounter: Payer: Self-pay | Admitting: Family Medicine

## 2016-09-24 DIAGNOSIS — M25531 Pain in right wrist: Secondary | ICD-10-CM | POA: Diagnosis not present

## 2016-09-24 DIAGNOSIS — M25631 Stiffness of right wrist, not elsewhere classified: Secondary | ICD-10-CM | POA: Diagnosis not present

## 2016-09-24 DIAGNOSIS — R531 Weakness: Secondary | ICD-10-CM | POA: Diagnosis not present

## 2016-09-24 DIAGNOSIS — M25541 Pain in joints of right hand: Secondary | ICD-10-CM | POA: Diagnosis not present

## 2016-09-30 DIAGNOSIS — R531 Weakness: Secondary | ICD-10-CM | POA: Diagnosis not present

## 2016-09-30 DIAGNOSIS — M25541 Pain in joints of right hand: Secondary | ICD-10-CM | POA: Diagnosis not present

## 2016-09-30 DIAGNOSIS — M25631 Stiffness of right wrist, not elsewhere classified: Secondary | ICD-10-CM | POA: Diagnosis not present

## 2016-09-30 DIAGNOSIS — M25531 Pain in right wrist: Secondary | ICD-10-CM | POA: Diagnosis not present

## 2016-10-02 DIAGNOSIS — M25541 Pain in joints of right hand: Secondary | ICD-10-CM | POA: Diagnosis not present

## 2016-10-02 DIAGNOSIS — M25631 Stiffness of right wrist, not elsewhere classified: Secondary | ICD-10-CM | POA: Diagnosis not present

## 2016-10-02 DIAGNOSIS — R531 Weakness: Secondary | ICD-10-CM | POA: Diagnosis not present

## 2016-10-02 DIAGNOSIS — M25531 Pain in right wrist: Secondary | ICD-10-CM | POA: Diagnosis not present

## 2016-10-06 ENCOUNTER — Ambulatory Visit: Payer: BLUE CROSS/BLUE SHIELD | Admitting: Family Medicine

## 2016-10-06 DIAGNOSIS — Z6841 Body Mass Index (BMI) 40.0 and over, adult: Secondary | ICD-10-CM | POA: Diagnosis not present

## 2016-10-06 DIAGNOSIS — Z136 Encounter for screening for cardiovascular disorders: Secondary | ICD-10-CM | POA: Diagnosis not present

## 2016-10-06 DIAGNOSIS — G4733 Obstructive sleep apnea (adult) (pediatric): Secondary | ICD-10-CM | POA: Diagnosis not present

## 2016-10-06 DIAGNOSIS — E8881 Metabolic syndrome: Secondary | ICD-10-CM | POA: Diagnosis not present

## 2016-10-09 DIAGNOSIS — M25531 Pain in right wrist: Secondary | ICD-10-CM | POA: Diagnosis not present

## 2016-10-09 DIAGNOSIS — M25631 Stiffness of right wrist, not elsewhere classified: Secondary | ICD-10-CM | POA: Diagnosis not present

## 2016-10-09 DIAGNOSIS — R531 Weakness: Secondary | ICD-10-CM | POA: Diagnosis not present

## 2016-10-09 DIAGNOSIS — M25541 Pain in joints of right hand: Secondary | ICD-10-CM | POA: Diagnosis not present

## 2016-10-14 DIAGNOSIS — M25631 Stiffness of right wrist, not elsewhere classified: Secondary | ICD-10-CM | POA: Diagnosis not present

## 2016-10-14 DIAGNOSIS — M25541 Pain in joints of right hand: Secondary | ICD-10-CM | POA: Diagnosis not present

## 2016-10-14 DIAGNOSIS — M25531 Pain in right wrist: Secondary | ICD-10-CM | POA: Diagnosis not present

## 2016-10-14 DIAGNOSIS — R531 Weakness: Secondary | ICD-10-CM | POA: Diagnosis not present

## 2016-10-27 ENCOUNTER — Ambulatory Visit: Payer: BLUE CROSS/BLUE SHIELD | Admitting: Family Medicine

## 2016-10-27 DIAGNOSIS — R531 Weakness: Secondary | ICD-10-CM | POA: Diagnosis not present

## 2016-10-27 DIAGNOSIS — M25631 Stiffness of right wrist, not elsewhere classified: Secondary | ICD-10-CM | POA: Diagnosis not present

## 2016-10-27 DIAGNOSIS — Z0189 Encounter for other specified special examinations: Secondary | ICD-10-CM

## 2016-10-27 DIAGNOSIS — M25531 Pain in right wrist: Secondary | ICD-10-CM | POA: Diagnosis not present

## 2016-10-27 DIAGNOSIS — M25541 Pain in joints of right hand: Secondary | ICD-10-CM | POA: Diagnosis not present

## 2016-10-29 DIAGNOSIS — M25631 Stiffness of right wrist, not elsewhere classified: Secondary | ICD-10-CM | POA: Diagnosis not present

## 2016-10-29 DIAGNOSIS — R531 Weakness: Secondary | ICD-10-CM | POA: Diagnosis not present

## 2016-10-29 DIAGNOSIS — M25531 Pain in right wrist: Secondary | ICD-10-CM | POA: Diagnosis not present

## 2016-10-29 DIAGNOSIS — M25541 Pain in joints of right hand: Secondary | ICD-10-CM | POA: Diagnosis not present

## 2016-10-30 ENCOUNTER — Telehealth: Payer: Self-pay | Admitting: Family Medicine

## 2016-10-30 ENCOUNTER — Other Ambulatory Visit: Payer: Self-pay | Admitting: Family Medicine

## 2016-10-30 NOTE — Telephone Encounter (Signed)
Please call pt to schedule diabeteic appt.

## 2016-11-02 ENCOUNTER — Other Ambulatory Visit: Payer: Self-pay | Admitting: *Deleted

## 2016-11-02 MED ORDER — VENLAFAXINE HCL ER 75 MG PO CP24: 75.0000 mg | ORAL_CAPSULE | Freq: Every day | ORAL | 0 refills | Status: DC

## 2016-11-03 DIAGNOSIS — M25541 Pain in joints of right hand: Secondary | ICD-10-CM | POA: Diagnosis not present

## 2016-11-03 DIAGNOSIS — M25631 Stiffness of right wrist, not elsewhere classified: Secondary | ICD-10-CM | POA: Diagnosis not present

## 2016-11-03 DIAGNOSIS — R531 Weakness: Secondary | ICD-10-CM | POA: Diagnosis not present

## 2016-11-03 DIAGNOSIS — M25531 Pain in right wrist: Secondary | ICD-10-CM | POA: Diagnosis not present

## 2016-11-05 ENCOUNTER — Other Ambulatory Visit: Payer: Self-pay | Admitting: Family Medicine

## 2016-11-05 DIAGNOSIS — R531 Weakness: Secondary | ICD-10-CM | POA: Diagnosis not present

## 2016-11-05 DIAGNOSIS — M25631 Stiffness of right wrist, not elsewhere classified: Secondary | ICD-10-CM | POA: Diagnosis not present

## 2016-11-05 DIAGNOSIS — M25531 Pain in right wrist: Secondary | ICD-10-CM | POA: Diagnosis not present

## 2016-11-05 DIAGNOSIS — M25541 Pain in joints of right hand: Secondary | ICD-10-CM | POA: Diagnosis not present

## 2016-11-06 NOTE — Telephone Encounter (Signed)
Called pt and left message to let her know she is due to come in for her diabetic fu--al

## 2016-11-08 ENCOUNTER — Encounter: Payer: Self-pay | Admitting: Family Medicine

## 2016-11-10 DIAGNOSIS — M25631 Stiffness of right wrist, not elsewhere classified: Secondary | ICD-10-CM | POA: Diagnosis not present

## 2016-11-10 DIAGNOSIS — M25541 Pain in joints of right hand: Secondary | ICD-10-CM | POA: Diagnosis not present

## 2016-11-10 DIAGNOSIS — R531 Weakness: Secondary | ICD-10-CM | POA: Diagnosis not present

## 2016-11-10 DIAGNOSIS — M25531 Pain in right wrist: Secondary | ICD-10-CM | POA: Diagnosis not present

## 2016-11-11 DIAGNOSIS — G4733 Obstructive sleep apnea (adult) (pediatric): Secondary | ICD-10-CM | POA: Diagnosis not present

## 2016-11-11 DIAGNOSIS — Z6841 Body Mass Index (BMI) 40.0 and over, adult: Secondary | ICD-10-CM | POA: Diagnosis not present

## 2016-11-11 DIAGNOSIS — E282 Polycystic ovarian syndrome: Secondary | ICD-10-CM | POA: Diagnosis not present

## 2016-11-12 DIAGNOSIS — M25631 Stiffness of right wrist, not elsewhere classified: Secondary | ICD-10-CM | POA: Diagnosis not present

## 2016-11-12 DIAGNOSIS — R531 Weakness: Secondary | ICD-10-CM | POA: Diagnosis not present

## 2016-11-12 DIAGNOSIS — M25531 Pain in right wrist: Secondary | ICD-10-CM | POA: Diagnosis not present

## 2016-11-12 DIAGNOSIS — M25541 Pain in joints of right hand: Secondary | ICD-10-CM | POA: Diagnosis not present

## 2016-11-16 DIAGNOSIS — K219 Gastro-esophageal reflux disease without esophagitis: Secondary | ICD-10-CM | POA: Insufficient documentation

## 2016-11-17 ENCOUNTER — Ambulatory Visit (INDEPENDENT_AMBULATORY_CARE_PROVIDER_SITE_OTHER): Payer: BLUE CROSS/BLUE SHIELD | Admitting: Family Medicine

## 2016-11-17 ENCOUNTER — Encounter: Payer: Self-pay | Admitting: Family Medicine

## 2016-11-17 VITALS — BP 128/67 | HR 78 | Ht 61.0 in | Wt 257.0 lb

## 2016-11-17 DIAGNOSIS — M25531 Pain in right wrist: Secondary | ICD-10-CM | POA: Diagnosis not present

## 2016-11-17 DIAGNOSIS — E119 Type 2 diabetes mellitus without complications: Secondary | ICD-10-CM

## 2016-11-17 DIAGNOSIS — F418 Other specified anxiety disorders: Secondary | ICD-10-CM

## 2016-11-17 DIAGNOSIS — G4733 Obstructive sleep apnea (adult) (pediatric): Secondary | ICD-10-CM

## 2016-11-17 DIAGNOSIS — M25541 Pain in joints of right hand: Secondary | ICD-10-CM | POA: Diagnosis not present

## 2016-11-17 DIAGNOSIS — M25631 Stiffness of right wrist, not elsewhere classified: Secondary | ICD-10-CM | POA: Diagnosis not present

## 2016-11-17 DIAGNOSIS — R531 Weakness: Secondary | ICD-10-CM | POA: Diagnosis not present

## 2016-11-17 DIAGNOSIS — Z9989 Dependence on other enabling machines and devices: Secondary | ICD-10-CM | POA: Diagnosis not present

## 2016-11-17 LAB — POCT GLYCOSYLATED HEMOGLOBIN (HGB A1C): HEMOGLOBIN A1C: 5.8

## 2016-11-17 MED ORDER — AMBULATORY NON FORMULARY MEDICATION: 0 refills | Status: DC

## 2016-11-17 MED ORDER — AMBULATORY NON FORMULARY MEDICATION: 99 refills | Status: DC

## 2016-11-17 NOTE — Progress Notes (Signed)
Subjective:    CC:   HPI:  Diabetes - no hypoglycemic events. No wounds or sores that are not healing well. No increased thirst or urination. Not checking glucose at home.  She doesn't currently have a glucometer. Taking medications as prescribed without any side effects. She reports that she had an eye exam done at [facility] [place].  F/U depression and anxiety - she is on Effexor. He is doing very well on her current medication regimen. No side effects or concerns.   Objective sleep apnea-she does need a download on her CPAP machine. She is planning for bariatric surgery probably in December and just needs proof that she is actually using the machine regularly. She's not having any difficulty with device or the mask itself.  Past medical history, Surgical history, Family history not pertinant except as noted below, Social history, Allergies, and medications have been entered into the medical record, reviewed, and corrections made.   Review of Systems: No fevers, chills, night sweats, weight loss, chest pain, or shortness of breath.   Objective:    General: Well Developed, well nourished, and in no acute distress.  Neuro: Alert and oriented x3, extra-ocular muscles intact, sensation grossly intact.  HEENT: Normocephalic, atraumatic  Skin: Warm and dry, no rashes. Cardiac: Regular rate and rhythm, no murmurs rubs or gallops, no lower extremity edema.  Respiratory: Clear to auscultation bilaterally. Not using accessory muscles, speaking in full sentences.   Impression and Recommendations:    DM - A1C of 5.8. Well controlled. Continue current regimen. Follow up in  4 months.   Depression/Anxiety - Stable. Continue with current dose of Effexor. PHQ 2 score of 0.  Obstructive sleep apnea-we'll get download from [facility] care for the last 30 days. Will make an extra copy for patient to have to take to her bariatric appointment.

## 2016-11-23 ENCOUNTER — Encounter: Payer: Self-pay | Admitting: Family Medicine

## 2016-11-23 DIAGNOSIS — Z6841 Body Mass Index (BMI) 40.0 and over, adult: Secondary | ICD-10-CM | POA: Diagnosis not present

## 2016-11-23 DIAGNOSIS — Z713 Dietary counseling and surveillance: Secondary | ICD-10-CM | POA: Diagnosis not present

## 2016-11-24 ENCOUNTER — Encounter: Payer: Self-pay | Admitting: Family Medicine

## 2016-11-24 DIAGNOSIS — M25531 Pain in right wrist: Secondary | ICD-10-CM | POA: Diagnosis not present

## 2016-11-24 DIAGNOSIS — M25631 Stiffness of right wrist, not elsewhere classified: Secondary | ICD-10-CM | POA: Diagnosis not present

## 2016-11-24 DIAGNOSIS — R531 Weakness: Secondary | ICD-10-CM | POA: Diagnosis not present

## 2016-11-24 DIAGNOSIS — M25541 Pain in joints of right hand: Secondary | ICD-10-CM | POA: Diagnosis not present

## 2016-11-26 ENCOUNTER — Other Ambulatory Visit: Payer: Self-pay

## 2016-11-26 DIAGNOSIS — M25531 Pain in right wrist: Secondary | ICD-10-CM | POA: Diagnosis not present

## 2016-11-26 DIAGNOSIS — R531 Weakness: Secondary | ICD-10-CM | POA: Diagnosis not present

## 2016-11-26 DIAGNOSIS — M25541 Pain in joints of right hand: Secondary | ICD-10-CM | POA: Diagnosis not present

## 2016-11-26 DIAGNOSIS — M25631 Stiffness of right wrist, not elsewhere classified: Secondary | ICD-10-CM | POA: Diagnosis not present

## 2016-11-26 NOTE — Telephone Encounter (Signed)
Left VM for Pt to see what company she is using. Pt note states [employer], but chart states [employer].

## 2016-11-27 DIAGNOSIS — Z6841 Body Mass Index (BMI) 40.0 and over, adult: Secondary | ICD-10-CM | POA: Diagnosis not present

## 2016-11-27 DIAGNOSIS — F329 Major depressive disorder, single episode, unspecified: Secondary | ICD-10-CM | POA: Diagnosis not present

## 2016-11-27 DIAGNOSIS — Z7189 Other specified counseling: Secondary | ICD-10-CM | POA: Diagnosis not present

## 2016-12-04 DIAGNOSIS — G4733 Obstructive sleep apnea (adult) (pediatric): Secondary | ICD-10-CM | POA: Diagnosis not present

## 2016-12-09 DIAGNOSIS — F329 Major depressive disorder, single episode, unspecified: Secondary | ICD-10-CM | POA: Diagnosis not present

## 2016-12-09 DIAGNOSIS — R5383 Other fatigue: Secondary | ICD-10-CM | POA: Diagnosis not present

## 2016-12-09 DIAGNOSIS — I10 Essential (primary) hypertension: Secondary | ICD-10-CM | POA: Diagnosis not present

## 2016-12-09 DIAGNOSIS — M2550 Pain in unspecified joint: Secondary | ICD-10-CM | POA: Diagnosis not present

## 2016-12-09 DIAGNOSIS — E282 Polycystic ovarian syndrome: Secondary | ICD-10-CM | POA: Diagnosis not present

## 2016-12-09 DIAGNOSIS — Z6841 Body Mass Index (BMI) 40.0 and over, adult: Secondary | ICD-10-CM | POA: Diagnosis not present

## 2016-12-09 DIAGNOSIS — K219 Gastro-esophageal reflux disease without esophagitis: Secondary | ICD-10-CM | POA: Diagnosis not present

## 2016-12-09 DIAGNOSIS — G4733 Obstructive sleep apnea (adult) (pediatric): Secondary | ICD-10-CM | POA: Diagnosis not present

## 2016-12-09 DIAGNOSIS — Z01818 Encounter for other preprocedural examination: Secondary | ICD-10-CM | POA: Diagnosis not present

## 2016-12-09 DIAGNOSIS — E785 Hyperlipidemia, unspecified: Secondary | ICD-10-CM | POA: Diagnosis not present

## 2016-12-09 DIAGNOSIS — G43909 Migraine, unspecified, not intractable, without status migrainosus: Secondary | ICD-10-CM | POA: Diagnosis not present

## 2016-12-09 DIAGNOSIS — G5600 Carpal tunnel syndrome, unspecified upper limb: Secondary | ICD-10-CM | POA: Diagnosis not present

## 2016-12-09 DIAGNOSIS — R7303 Prediabetes: Secondary | ICD-10-CM | POA: Diagnosis not present

## 2016-12-09 DIAGNOSIS — K2970 Gastritis, unspecified, without bleeding: Secondary | ICD-10-CM | POA: Diagnosis not present

## 2016-12-09 DIAGNOSIS — M1990 Unspecified osteoarthritis, unspecified site: Secondary | ICD-10-CM | POA: Diagnosis not present

## 2016-12-11 ENCOUNTER — Other Ambulatory Visit: Payer: Self-pay | Admitting: Family Medicine

## 2016-12-17 DIAGNOSIS — R748 Abnormal levels of other serum enzymes: Secondary | ICD-10-CM | POA: Diagnosis not present

## 2016-12-17 DIAGNOSIS — Z6841 Body Mass Index (BMI) 40.0 and over, adult: Secondary | ICD-10-CM | POA: Diagnosis not present

## 2016-12-17 DIAGNOSIS — E8881 Metabolic syndrome: Secondary | ICD-10-CM | POA: Diagnosis not present

## 2016-12-21 ENCOUNTER — Other Ambulatory Visit: Payer: Self-pay | Admitting: Family Medicine

## 2016-12-22 DIAGNOSIS — K760 Fatty (change of) liver, not elsewhere classified: Secondary | ICD-10-CM | POA: Diagnosis not present

## 2016-12-22 DIAGNOSIS — R7989 Other specified abnormal findings of blood chemistry: Secondary | ICD-10-CM | POA: Diagnosis not present

## 2016-12-22 DIAGNOSIS — E669 Obesity, unspecified: Secondary | ICD-10-CM | POA: Insufficient documentation

## 2016-12-22 DIAGNOSIS — Z9049 Acquired absence of other specified parts of digestive tract: Secondary | ICD-10-CM | POA: Diagnosis not present

## 2016-12-22 DIAGNOSIS — R748 Abnormal levels of other serum enzymes: Secondary | ICD-10-CM | POA: Diagnosis not present

## 2016-12-22 DIAGNOSIS — Z6841 Body Mass Index (BMI) 40.0 and over, adult: Secondary | ICD-10-CM

## 2016-12-23 DIAGNOSIS — Z713 Dietary counseling and surveillance: Secondary | ICD-10-CM | POA: Diagnosis not present

## 2016-12-23 DIAGNOSIS — Z6841 Body Mass Index (BMI) 40.0 and over, adult: Secondary | ICD-10-CM | POA: Diagnosis not present

## 2016-12-24 DIAGNOSIS — Z6841 Body Mass Index (BMI) 40.0 and over, adult: Secondary | ICD-10-CM | POA: Diagnosis not present

## 2016-12-24 DIAGNOSIS — E282 Polycystic ovarian syndrome: Secondary | ICD-10-CM | POA: Diagnosis not present

## 2016-12-24 DIAGNOSIS — E8881 Metabolic syndrome: Secondary | ICD-10-CM | POA: Diagnosis not present

## 2017-01-13 DIAGNOSIS — Z01818 Encounter for other preprocedural examination: Secondary | ICD-10-CM | POA: Diagnosis not present

## 2017-01-18 DIAGNOSIS — Z6841 Body Mass Index (BMI) 40.0 and over, adult: Secondary | ICD-10-CM | POA: Diagnosis not present

## 2017-01-18 DIAGNOSIS — E7800 Pure hypercholesterolemia, unspecified: Secondary | ICD-10-CM | POA: Diagnosis not present

## 2017-01-18 DIAGNOSIS — F329 Major depressive disorder, single episode, unspecified: Secondary | ICD-10-CM | POA: Diagnosis not present

## 2017-01-18 DIAGNOSIS — K219 Gastro-esophageal reflux disease without esophagitis: Secondary | ICD-10-CM | POA: Diagnosis not present

## 2017-01-18 DIAGNOSIS — E282 Polycystic ovarian syndrome: Secondary | ICD-10-CM | POA: Diagnosis not present

## 2017-01-18 DIAGNOSIS — Z9989 Dependence on other enabling machines and devices: Secondary | ICD-10-CM | POA: Diagnosis not present

## 2017-01-18 DIAGNOSIS — K760 Fatty (change of) liver, not elsewhere classified: Secondary | ICD-10-CM | POA: Diagnosis not present

## 2017-01-18 DIAGNOSIS — Z79899 Other long term (current) drug therapy: Secondary | ICD-10-CM | POA: Diagnosis not present

## 2017-01-18 DIAGNOSIS — R7303 Prediabetes: Secondary | ICD-10-CM | POA: Diagnosis not present

## 2017-01-18 DIAGNOSIS — G4733 Obstructive sleep apnea (adult) (pediatric): Secondary | ICD-10-CM | POA: Diagnosis not present

## 2017-01-18 DIAGNOSIS — Z791 Long term (current) use of non-steroidal anti-inflammatories (NSAID): Secondary | ICD-10-CM | POA: Diagnosis not present

## 2017-01-18 HISTORY — PX: LAPAROSCOPIC GASTRIC SLEEVE RESECTION: SHX5895

## 2017-02-03 ENCOUNTER — Other Ambulatory Visit: Payer: Self-pay | Admitting: Family Medicine

## 2017-02-04 MED ORDER — VENLAFAXINE HCL ER 75 MG PO CP24: 75.0000 mg | ORAL_CAPSULE | Freq: Every day | ORAL | 1 refills | Status: DC

## 2017-02-04 NOTE — Telephone Encounter (Signed)
Please advise on medication refill...thanks Last seen:11/23/16 Last refill: 11/02/16

## 2017-02-10 DIAGNOSIS — Z6841 Body Mass Index (BMI) 40.0 and over, adult: Secondary | ICD-10-CM | POA: Diagnosis not present

## 2017-02-10 DIAGNOSIS — Z713 Dietary counseling and surveillance: Secondary | ICD-10-CM | POA: Diagnosis not present

## 2017-03-23 ENCOUNTER — Ambulatory Visit: Payer: BLUE CROSS/BLUE SHIELD | Admitting: Family Medicine

## 2017-03-26 ENCOUNTER — Encounter: Payer: Self-pay | Admitting: Family Medicine

## 2017-03-26 ENCOUNTER — Ambulatory Visit: Payer: BLUE CROSS/BLUE SHIELD | Admitting: Family Medicine

## 2017-03-26 VITALS — BP 125/88 | HR 71 | Ht 61.0 in | Wt 209.0 lb

## 2017-03-26 DIAGNOSIS — M654 Radial styloid tenosynovitis [de Quervain]: Secondary | ICD-10-CM | POA: Diagnosis not present

## 2017-03-26 DIAGNOSIS — G5601 Carpal tunnel syndrome, right upper limb: Secondary | ICD-10-CM

## 2017-03-26 NOTE — Progress Notes (Signed)
Tonya Phillips is a 47 y.o. female who presents to [facility] today for right wrist pain.  Tonya Phillips has a history of bilateral wrist pain and dysfunction.  I saw her last for de Quervain's tenosynovitis on December 2017.  At that time she received a de Quervain's injection and had really good results until recently.  She notes a month or so of pain and swelling at the radial aspect of her right wrist worse with ulnar deviation of the hand.  This pain is limiting her activity and her ability to function.  Previous to the last month or so she has been doing pretty well and denies any significant de Quervain's related wrist pain.  Additionally Tonya Phillips was been seen previously for carpal tunnel syndrome of the right wrist.  She is doing well with this and denies any significant paresthesias or pain.  She has had hand therapy in the past for this which she did find helpful as well.  Past Medical History:  Diagnosis Date  . Right carpal tunnel syndrome    EMG scanned in  . Suicide attempt [facility]) 2011   Past Surgical History:  Procedure Laterality Date  . CHOLECYSTECTOMY    . NASAL SEPTUM SURGERY     Social History   Tobacco Use  . Smoking status: Never Smoker  . Smokeless tobacco: Never Used  Substance Use Topics  . Alcohol use: Yes    Alcohol/week: 0.0 oz    Comment: occas     ROS:  As above   Medications: Current Outpatient Medications  Medication Sig Dispense Refill  . AMBULATORY NON FORMULARY MEDICATION Medication Name: CPAP, humidifier and supplies set to 12 cm water pressure. . Dx OSA. [employer]. 1 vial 0  . AMBULATORY NON FORMULARY MEDICATION Medication Name: Glucometer with lancet and strips as covered by her insurance.  Dx. Diabetes.  Test once a day.  #100 lancet and strips. 1 Units PRN  . AMBULATORY NON FORMULARY MEDICATION Medication Name: Please download last 30 days from CPAP from [employer]. 1 vial 0  . atorvastatin (LIPITOR) 20 MG  tablet TAKE 1 TABLET BY MOUTH EVERY DAY 90 tablet 0  . BD PEN NEEDLE NANO U/F 32G X 4 MM MISC USE DAILY WITH VICTOZA PEN. 100 each 11  . blood glucose meter kit and supplies KIT Dispense based on patient and insurance preference. Use up to once daily as directed. (FOR ICD-9 250.00, 250.01). Please include lancets and strips. 1 each PRN  . liraglutide (VICTOZA) 18 MG/3ML SOPN Inject 0.2 mLs (1.2 mg total) into the skin daily. Inject 1.2 mg into the skin daily 9 pen 3  . metFORMIN (GLUMETZA) 1000 MG (MOD) 24 hr tablet TAKE 1 TABLET (1,000 MG TOTAL) BY MOUTH DAILY WITH BREAKFAST. 90 tablet 0  . venlafaxine XR (EFFEXOR-XR) 75 MG 24 hr capsule Take 1 capsule (75 mg total) by mouth daily. 90 capsule 1   No current facility-administered medications for this visit.    No Known Allergies   Exam:  BP 125/88   Pulse 71   Ht '5\' 1"'  (1.549 m)   Wt 209 lb (94.8 kg)   BMI 39.49 kg/m  General: Well Developed, well nourished, and in no acute distress.  Neuro/Psych: Alert and oriented x3, extra-ocular muscles intact, able to move all 4 extremities, sensation grossly intact. Skin: Warm and dry, no rashes noted.  Respiratory: Not using accessory muscles, speaking in full sentences, trachea midline.  Cardiovascular: Pulses palpable, no extremity edema. Abdomen:  Does not appear distended. MSK:  Right wrist normal-appearing.  Tender to palpation over the radial styloid. Range of motion is intact over pain is present with ulnar deviation. Positive Finkelstein's test. Pulses capillary refill and sensation are intact Negative Tinel's at carpal tunnel.  Limited musculoskeletal ultrasound of the left wrist reveals an intact first wrist compartment tendons fracture with intact tendons.  There is hypoechoic fluid surrounding the first wrist compartment tendons indicating tenosynovitis.  Normal bony structures.  X-ray right wrist dated July 2018 independently reviewed by myself.  No acute bony changes.  No  significant DJD.  Procedure: Real-time Ultrasound Guided Injection of right dorsal first wrist compartment Device: GE Logiq E  Images permanently stored and available for review in the ultrasound unit. Verbal informed consent obtained. Discussed risks and benefits of procedure. Warned about infection bleeding damage to structures skin hypopigmentation and fat atrophy among others. Patient expresses understanding and agreement Time-out conducted.  Noted no overlying erythema, induration, or other signs of local infection.  Skin prepped in a sterile fashion.  Local anesthesia: Topical Ethyl chloride.  With sterile technique and under real time ultrasound guidance:1 mL of lidocaine and 10 mg of dexamethasone injected easily.  Completed without difficulty  Pain immediately resolved suggesting accurate placement of the medication.  Advised to call if fevers/chills, erythema, induration, drainage, or persistent bleeding.  Images permanently stored and available for review in the ultrasound unit.  Impression: Technically successful ultrasound guided injection.      No results found for this or any previous visit (from the past 48 hour(s)). No results found.    Assessment and Plan: 47 y.o. female with  De Quervain's tenosynovitis of the right wrist. This is an exacerbation of a chronic medical problem. Plan for injection and repeat splinting.  Recheck in a few weeks if not better.   Carpal tunnel syndrome: Doing well continue watchful waiting and bracing as needed.  No orders of the defined types were placed in this encounter.  No orders of the defined types were placed in this encounter.   Discussed warning signs or symptoms. Please see discharge instructions. Patient expresses understanding.

## 2017-03-26 NOTE — Patient Instructions (Signed)
Thank you for coming in today. Call or go to the ER if you develop a large red swollen joint with extreme pain or oozing puss.  Use a brace for 2 weeks,.  Recheck as needed.

## 2017-03-31 ENCOUNTER — Telehealth: Payer: Self-pay | Admitting: Family Medicine

## 2017-03-31 NOTE — Telephone Encounter (Signed)
PA was not reqired for Metformin ER Tab 1000MG GZ per Covermymeds. Form sent to scan.

## 2017-04-01 DIAGNOSIS — Z9884 Bariatric surgery status: Secondary | ICD-10-CM | POA: Diagnosis not present

## 2017-04-01 DIAGNOSIS — Z713 Dietary counseling and surveillance: Secondary | ICD-10-CM | POA: Diagnosis not present

## 2017-04-01 DIAGNOSIS — E6609 Other obesity due to excess calories: Secondary | ICD-10-CM | POA: Diagnosis not present

## 2017-04-02 NOTE — Telephone Encounter (Signed)
Your PA has been resolved, no additional PA is required per [facility].   Case ID: [id number]

## 2017-04-24 ENCOUNTER — Other Ambulatory Visit: Payer: Self-pay | Admitting: Family Medicine

## 2017-05-04 DIAGNOSIS — G4733 Obstructive sleep apnea (adult) (pediatric): Secondary | ICD-10-CM | POA: Diagnosis not present

## 2017-05-17 ENCOUNTER — Encounter: Payer: Self-pay | Admitting: Family Medicine

## 2017-05-17 MED ORDER — MONTELUKAST SODIUM 10 MG PO TABS: 10.0000 mg | ORAL_TABLET | Freq: Every day | ORAL | 3 refills | Status: DC

## 2017-07-09 DIAGNOSIS — K912 Postsurgical malabsorption, not elsewhere classified: Secondary | ICD-10-CM | POA: Diagnosis not present

## 2017-07-09 DIAGNOSIS — Z903 Acquired absence of stomach [part of]: Secondary | ICD-10-CM | POA: Diagnosis not present

## 2017-07-23 DIAGNOSIS — K912 Postsurgical malabsorption, not elsewhere classified: Secondary | ICD-10-CM | POA: Diagnosis not present

## 2017-07-23 DIAGNOSIS — Z903 Acquired absence of stomach [part of]: Secondary | ICD-10-CM | POA: Diagnosis not present

## 2017-08-01 ENCOUNTER — Other Ambulatory Visit: Payer: Self-pay | Admitting: Family Medicine

## 2017-08-06 ENCOUNTER — Other Ambulatory Visit: Payer: Self-pay | Admitting: Family Medicine

## 2017-08-06 ENCOUNTER — Encounter: Payer: Self-pay | Admitting: Family Medicine

## 2017-08-06 MED ORDER — VENLAFAXINE HCL ER 75 MG PO CP24
75.0000 mg | ORAL_CAPSULE | Freq: Every day | ORAL | 0 refills | Status: DC
Start: 2017-08-06 — End: 2017-10-19

## 2017-08-06 NOTE — Telephone Encounter (Signed)
It does look like she has scheduled an appointment for July so we will go ahead and send over 30-day supply.  But she does need to keep that appointment.

## 2017-08-14 ENCOUNTER — Other Ambulatory Visit: Payer: Self-pay | Admitting: Family Medicine

## 2017-09-03 ENCOUNTER — Ambulatory Visit: Payer: BLUE CROSS/BLUE SHIELD | Admitting: Family Medicine

## 2017-09-20 DIAGNOSIS — M4802 Spinal stenosis, cervical region: Secondary | ICD-10-CM | POA: Diagnosis not present

## 2017-09-20 DIAGNOSIS — E119 Type 2 diabetes mellitus without complications: Secondary | ICD-10-CM | POA: Diagnosis not present

## 2017-09-20 DIAGNOSIS — M542 Cervicalgia: Secondary | ICD-10-CM | POA: Diagnosis not present

## 2017-09-20 DIAGNOSIS — Z79899 Other long term (current) drug therapy: Secondary | ICD-10-CM | POA: Diagnosis not present

## 2017-09-20 DIAGNOSIS — E7800 Pure hypercholesterolemia, unspecified: Secondary | ICD-10-CM | POA: Diagnosis not present

## 2017-09-20 DIAGNOSIS — S139XXA Sprain of joints and ligaments of unspecified parts of neck, initial encounter: Secondary | ICD-10-CM | POA: Diagnosis not present

## 2017-09-20 DIAGNOSIS — M47892 Other spondylosis, cervical region: Secondary | ICD-10-CM | POA: Diagnosis not present

## 2017-09-20 DIAGNOSIS — F329 Major depressive disorder, single episode, unspecified: Secondary | ICD-10-CM | POA: Diagnosis not present

## 2017-09-20 DIAGNOSIS — G8911 Acute pain due to trauma: Secondary | ICD-10-CM | POA: Diagnosis not present

## 2017-09-20 DIAGNOSIS — E282 Polycystic ovarian syndrome: Secondary | ICD-10-CM | POA: Diagnosis not present

## 2017-09-20 DIAGNOSIS — M25511 Pain in right shoulder: Secondary | ICD-10-CM | POA: Diagnosis not present

## 2017-09-20 DIAGNOSIS — S6991XA Unspecified injury of right wrist, hand and finger(s), initial encounter: Secondary | ICD-10-CM | POA: Diagnosis not present

## 2017-09-20 DIAGNOSIS — H9201 Otalgia, right ear: Secondary | ICD-10-CM | POA: Diagnosis not present

## 2017-09-20 DIAGNOSIS — R51 Headache: Secondary | ICD-10-CM | POA: Diagnosis not present

## 2017-09-20 DIAGNOSIS — S134XXA Sprain of ligaments of cervical spine, initial encounter: Secondary | ICD-10-CM | POA: Diagnosis not present

## 2017-09-20 DIAGNOSIS — Z9884 Bariatric surgery status: Secondary | ICD-10-CM | POA: Diagnosis not present

## 2017-09-20 DIAGNOSIS — S60211A Contusion of right wrist, initial encounter: Secondary | ICD-10-CM | POA: Diagnosis not present

## 2017-09-23 ENCOUNTER — Ambulatory Visit (INDEPENDENT_AMBULATORY_CARE_PROVIDER_SITE_OTHER): Payer: BLUE CROSS/BLUE SHIELD | Admitting: Osteopathic Medicine

## 2017-09-23 ENCOUNTER — Encounter: Payer: Self-pay | Admitting: Osteopathic Medicine

## 2017-09-23 VITALS — BP 114/78 | HR 65 | Temp 98.5°F | Wt 194.9 lb

## 2017-09-23 DIAGNOSIS — S134XXD Sprain of ligaments of cervical spine, subsequent encounter: Secondary | ICD-10-CM | POA: Diagnosis not present

## 2017-09-23 NOTE — Patient Instructions (Signed)
Tylenol can take up to 1000 mg up to 4 times per day Continue the muscle relaxers, can take 1/2 tablet during the day Home exercises - printed Work note - printed  If worse/persist symptoms, please follow up with [name].
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
## 2017-10-19 ENCOUNTER — Encounter: Payer: Self-pay | Admitting: Family Medicine

## 2017-10-19 ENCOUNTER — Ambulatory Visit (INDEPENDENT_AMBULATORY_CARE_PROVIDER_SITE_OTHER): Payer: BLUE CROSS/BLUE SHIELD | Admitting: Family Medicine

## 2017-10-19 VITALS — BP 125/68 | HR 71 | Ht 61.0 in | Wt 194.0 lb

## 2017-10-19 DIAGNOSIS — N926 Irregular menstruation, unspecified: Secondary | ICD-10-CM

## 2017-10-19 DIAGNOSIS — F418 Other specified anxiety disorders: Secondary | ICD-10-CM

## 2017-10-19 DIAGNOSIS — J069 Acute upper respiratory infection, unspecified: Secondary | ICD-10-CM

## 2017-10-19 DIAGNOSIS — Z9889 Other specified postprocedural states: Secondary | ICD-10-CM

## 2017-10-19 DIAGNOSIS — Z1231 Encounter for screening mammogram for malignant neoplasm of breast: Secondary | ICD-10-CM | POA: Diagnosis not present

## 2017-10-19 MED ORDER — VENLAFAXINE HCL ER 150 MG PO CP24: 150.0000 mg | ORAL_CAPSULE | Freq: Every day | ORAL | 3 refills | Status: DC
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
## 2017-11-25 DIAGNOSIS — G4733 Obstructive sleep apnea (adult) (pediatric): Secondary | ICD-10-CM | POA: Diagnosis not present

## 2017-12-08 ENCOUNTER — Other Ambulatory Visit: Payer: Self-pay | Admitting: Family Medicine

## 2017-12-31 DIAGNOSIS — K912 Postsurgical malabsorption, not elsewhere classified: Secondary | ICD-10-CM | POA: Diagnosis not present

## 2017-12-31 DIAGNOSIS — Z903 Acquired absence of stomach [part of]: Secondary | ICD-10-CM | POA: Diagnosis not present

## 2017-12-31 LAB — BASIC METABOLIC PANEL
BUN: 16 (ref 4–21)
Creatinine: 0.7 (ref 0.5–1.1)
Glucose: 94
Potassium: 4.8 (ref 3.4–5.3)
Sodium: 143 (ref 137–147)

## 2017-12-31 LAB — VITAMIN D 25 HYDROXY (VIT D DEFICIENCY, FRACTURES): Vit D, 25-Hydroxy: 104

## 2017-12-31 LAB — HEPATIC FUNCTION PANEL
ALT: 19 (ref 7–35)
AST: 19 (ref 13–35)
Alkaline Phosphatase: 61 (ref 25–125)
Bilirubin, Total: 0.4

## 2017-12-31 LAB — LIPID PANEL
Cholesterol: 223 — AB (ref 0–200)
HDL: 42 (ref 35–70)
LDL CALC: 153
Triglycerides: 140 (ref 40–160)

## 2017-12-31 LAB — HEMOGLOBIN A1C: Hemoglobin A1C: 6.4

## 2017-12-31 LAB — CBC AND DIFFERENTIAL
HEMATOCRIT: 42 (ref 36–46)
HEMOGLOBIN: 14.3 (ref 12.0–16.0)
WBC: 9.6

## 2018-01-01 LAB — HEMOGLOBIN A1C: Hemoglobin A1C: 5.2

## 2018-01-10 ENCOUNTER — Encounter: Payer: Self-pay | Admitting: Family Medicine

## 2018-01-10 ENCOUNTER — Ambulatory Visit: Payer: BLUE CROSS/BLUE SHIELD | Admitting: Family Medicine

## 2018-01-10 VITALS — BP 125/77 | HR 71 | Ht 59.84 in | Wt 196.0 lb

## 2018-01-10 DIAGNOSIS — E282 Polycystic ovarian syndrome: Secondary | ICD-10-CM

## 2018-01-10 DIAGNOSIS — F418 Other specified anxiety disorders: Secondary | ICD-10-CM | POA: Diagnosis not present

## 2018-01-10 DIAGNOSIS — E7849 Other hyperlipidemia: Secondary | ICD-10-CM

## 2018-01-10 MED ORDER — VENLAFAXINE HCL ER 150 MG PO CP24: 150.0000 mg | ORAL_CAPSULE | Freq: Every day | ORAL | 1 refills | Status: DC

## 2018-01-10 NOTE — Progress Notes (Signed)
Subjective:    CC:   HPI:  47 year old female is here today for follow-up depression/anxiety-currently on Effexor 150 mg daily.  We have increased her dose to 150 mg when I saw her in September.  Actually been really happy with this regimen she feels like it has been really helpful.  Plus some of the stressors at work have improved and that is also been helpful.  PCOS -labs followed by Dr. Evorn Gong from bariatrics.  Last hemoglobin A1c was 5.2 in November which is fantastic.  Hyperlipidemia - did her cholesterol levels at Dr. Roosvelt Harps office.  She did have some questions about the best diet to follow for this.  Past medical history, Surgical history, Family history not pertinant except as noted below, Social history, Allergies, and medications have been entered into the medical record, reviewed, and corrections made.   Review of Systems: No fevers, chills, night sweats, weight loss, chest pain, or shortness of breath.   Objective:    General: Well Developed, well nourished, and in no acute distress.  Neuro: Alert and oriented x3, extra-ocular muscles intact, sensation grossly intact.  HEENT: Normocephalic, atraumatic  Skin: Warm and dry, no rashes. Cardiac: Regular rate and rhythm, no murmurs rubs or gallops, no lower extremity edema.  Respiratory: Clear to auscultation bilaterally. Not using accessory muscles, speaking in full sentences.   Impression and Recommendations:    Depression/anxiety- PHQ- 9 score of 3 and GAD 7 score of 2. Much improved from previous.    Happy with regimen of venlafaxine 150 mg daily we will continue with current regimen I will see her back in 4 to 6 months.  Ultimately her goal would be to wean the medication but I would like for her to be steady on this regimen for at least 6 months before we start doing that.  PCOS -recent labs including hemoglobin A1c and CMP performed with Dr. Evorn Gong.  Hyperlipidemia-last LDL was 153 in November with Dr. Evorn Gong.  We  also ordered her mammogram when she was here last time but says no one ever called her.  I encouraged her to go downstairs today to go ahead and schedule it at her convenience.  Also encouraged her to come back sometime next month and schedule physical with Pap smear  Care Everywhere Result Report Comp. Metabolic Panel (16)[id number]: 01/01/2018 4:35 AM [facility] Component Name Value Ref Range  Glucose 94 65 - 99 mg/dL  BUN 16 6 - 24 mg/dL  Creatinine, Serum 0.71 0.57 - 1 mg/dL  eGFR If NonAfrican American 103 >59 mL/min/1.73  eGFR If African American 119 >59 mL/min/1.73  Interpretation Comment  Comment: GFR estimate at the following level for >or=3 months is classified as follows:  GFR        WITH KIDNEY DAMAGE  WITHOUT KIDNEY DAMAGE >or=90          Stage 1         Normal 60-89          Stage 2       "Decreased GFR" 30-59          Stage 3         Stage 3 15-29          Stage 4         Stage 4 <15 (or dialysis)    Stage 5         Stage 5 Estimated GFR will over estimate true GFR if serum creatinine is rising as in acute renal failure and  will under estimate true GFR if serum creatinine is declining as in resolving acute renal failure. Additional information may be found at [email or web address].   BUN/Creatinine Ratio 23 9 - 23   Sodium 143 134 - 144 mmol/L  Potassium 4.8 3.5 - 5.2 mmol/L  Chloride 100 96 - 106 mmol/L  CO2 27 20 - 29 mmol/L  CALCIUM 9.6 8.7 - 10.2 mg/dL  Total Protein 7.3 6 - 8.5 g/dL  Albumin, Serum 4.5 3.5 - 5.5 g/dL  Globulin, Total 2.8 1.5 - 4.5 g/dL  Albumin/Globulin Ratio 1.6 1.2 - 2.2   Total Bilirubin 0.4 0 - 1.2 mg/dL  Alkaline Phosphatase 61 39 - 117 IU/L  AST 19 0 - 40 IU/L  ALT (SGPT) 19 0 - 32 IU/L  Specimen Collected on  Blood - Entire vein (body structure) 12/31/2017 7:55 AM  Result Narrative  Performed at: [facility]

## 2018-01-10 NOTE — Patient Instructions (Signed)
Preventing High Cholesterol Cholesterol is a waxy, fat-like substance that your body needs in small amounts. Your liver makes all the cholesterol that your body needs. Having high cholesterol (hypercholesterolemia) increases your risk for heart disease and stroke. Extra (excess) cholesterol comes from the food you eat, such as animal-based fat (saturated fat) from meat and some dairy products. High cholesterol can often be prevented with diet and lifestyle changes. If you already have high cholesterol, you can control it with diet and lifestyle changes, as well as medicine. What nutrition changes can be made?  Eat less saturated fat. Foods that contain saturated fat include red meat and some dairy products.  Avoid processed meats, like bacon and lunch meats.  Avoid trans fats, which are found in margarine and some baked goods.  Avoid foods and beverages that have added sugars.  Eat more fruits, vegetables, and whole grains.  Choose healthy sources of protein, such as fish, poultry, and nuts.  Choose healthy sources of fat, such as: ? Nuts. ? Vegetable oils, especially olive oil. ? Fish that have healthy fats (omega-3 fatty acids), such as mackerel or salmon. What lifestyle changes can be made?  Lose weight if you are overweight. Losing 5-10 lb (2.3-4.5 kg) can help prevent or control high cholesterol and reduce your risk for diabetes and high blood pressure. Ask your health care provider to help you with a diet and exercise plan to safely lose weight.  Get enough exercise. Do at least 150 minutes of moderate-intensity exercise each week. ? You could do this in short exercise sessions several times a day, or you could do longer exercise sessions a few times a week. For example, you could take a brisk 10-minute walk or bike ride, 3 times a day, for 5 days a week.  Do not smoke. If you need help quitting, ask your health care provider.  Limit your alcohol intake. If you drink alcohol,  limit alcohol intake to no more than 1 drink a day for nonpregnant women and 2 drinks a day for men. One drink equals 12 oz of beer, 5 oz of wine, or 1 oz of hard liquor. Why are these changes important? If you have high cholesterol, deposits (plaques) may build up on the walls of your blood vessels. Plaques make the arteries narrower and stiffer, which can restrict or block blood flow and cause blood clots to form. This greatly increases your risk for heart attack and stroke. Making diet and lifestyle changes can reduce your risk for these life-threatening conditions. What can I do to lower my risk?  Manage your risk factors for high cholesterol. Talk with your health care provider about all of your risk factors and how to lower your risk.  Manage other conditions that you have, such as diabetes or high blood pressure (hypertension).  Have your cholesterol checked at regular intervals.  Keep all follow-up visits as told by your health care provider. This is important. How is this treated? In addition to diet and lifestyle changes, your health care provider may recommend medicines to help lower cholesterol, such as a medicine to reduce the amount of cholesterol made in your liver. You may need medicine if:  Diet and lifestyle changes do not lower your cholesterol enough.  You have high cholesterol and other risk factors for heart disease or stroke.  Take over-the-counter and prescription medicines only as told by your health care provider. Where to find more information:  American Heart Association: ThisTune.com.pt.jsp  National Heart, Lung, and  Blood Institute: FrenchToiletries.com.cy Summary  High cholesterol increases your risk for heart disease and stroke. By keeping your cholesterol level low, you can reduce your risk for these conditions.  Diet and lifestyle changes  are the most important steps in preventing high cholesterol.  Work with your health care provider to manage your risk factors, and have your blood tested regularly. This information is not intended to replace advice given to you by your health care provider. Make sure you discuss any questions you have with your health care provider. Document Released: 02/17/2015 Document Revised: 10/12/2015 Document Reviewed: 10/12/2015 Elsevier Interactive Patient Education  [employer].

## 2018-01-18 DIAGNOSIS — K912 Postsurgical malabsorption, not elsewhere classified: Secondary | ICD-10-CM | POA: Diagnosis not present

## 2018-01-18 DIAGNOSIS — Z903 Acquired absence of stomach [part of]: Secondary | ICD-10-CM | POA: Diagnosis not present

## 2018-01-28 DIAGNOSIS — E282 Polycystic ovarian syndrome: Secondary | ICD-10-CM | POA: Diagnosis not present

## 2018-01-28 DIAGNOSIS — Z903 Acquired absence of stomach [part of]: Secondary | ICD-10-CM | POA: Diagnosis not present

## 2018-01-28 DIAGNOSIS — Z6837 Body mass index (BMI) 37.0-37.9, adult: Secondary | ICD-10-CM | POA: Diagnosis not present

## 2018-01-28 DIAGNOSIS — E669 Obesity, unspecified: Secondary | ICD-10-CM | POA: Diagnosis not present

## 2018-02-11 ENCOUNTER — Encounter: Payer: Self-pay | Admitting: Family Medicine

## 2018-02-11 ENCOUNTER — Ambulatory Visit (INDEPENDENT_AMBULATORY_CARE_PROVIDER_SITE_OTHER): Payer: BLUE CROSS/BLUE SHIELD | Admitting: Family Medicine

## 2018-02-11 ENCOUNTER — Ambulatory Visit (INDEPENDENT_AMBULATORY_CARE_PROVIDER_SITE_OTHER): Payer: BLUE CROSS/BLUE SHIELD

## 2018-02-11 ENCOUNTER — Other Ambulatory Visit (HOSPITAL_COMMUNITY)
Admission: RE | Admit: 2018-02-11 | Discharge: 2018-02-11 | Disposition: A | Discharge status: Home / Self Care | Payer: BLUE CROSS/BLUE SHIELD | Source: Ambulatory Visit | Attending: Family Medicine | Admitting: Family Medicine

## 2018-02-11 VITALS — BP 117/73 | HR 76 | Ht 61.0 in | Wt 192.0 lb

## 2018-02-11 DIAGNOSIS — Z1231 Encounter for screening mammogram for malignant neoplasm of breast: Secondary | ICD-10-CM | POA: Diagnosis not present

## 2018-02-11 DIAGNOSIS — Z0000 Encounter for general adult medical examination without abnormal findings: Secondary | ICD-10-CM

## 2018-02-11 DIAGNOSIS — Z124 Encounter for screening for malignant neoplasm of cervix: Secondary | ICD-10-CM | POA: Diagnosis not present

## 2018-02-11 NOTE — Patient Instructions (Signed)

## 2018-02-11 NOTE — Progress Notes (Signed)
Subjective:     Tonya Phillips is a 47 y.o. female and is here for a comprehensive physical exam. The patient reports no problems.  Went for her mammogram today.    Social History   Socioeconomic History  . Marital status: Divorced    Spouse name: Not on file  . Number of children: 0  . Years of education: college  . Highest education level: Not on file  Occupational History  . 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   - degree in [employer].   Social Needs  . Financial resource strain: Not on file  . Food insecurity:    Worry: Not on file    Inability: Not on file  . Transportation needs:    Medical: Not on file    Non-medical: Not on file  Tobacco Use  . Smoking status: Never Smoker  . Smokeless tobacco: Never Used  Substance and Sexual Activity  . Alcohol use: Yes    Alcohol/week: 0.0 standard drinks    Comment: occas  . Drug use: No  . Sexual activity: Not Currently  Lifestyle  . Physical activity:    Days per week: Not on file    Minutes per session: Not on file  . Stress: Not on file  Relationships  . Social connections:    Talks on phone: Not on file    Gets together: Not on file    Attends religious service: Not on file    Active member of club or organization: Not on file    Attends meetings of clubs or organizations: Not on file    Relationship status: Not on file  . Intimate partner violence:    Fear of current or ex partner: Not on file    Emotionally abused: Not on file    Physically abused: Not on file    Forced sexual activity: Not on file  Other Topics Concern  . 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 Her parents live with her.   Health Maintenance  Topic Date Due  . INFLUENZA VACCINE  06/08/2018 (Originally 09/16/2017)  . URINE MICROALBUMIN  02/16/2020 (Originally 02/23/2017)  . PAP SMEAR-Modifier  02/15/2022 (Originally 11/21/1993)  . HIV Screening   02/15/2029 (Originally 11/22/1987)  . TETANUS/TDAP  02/23/2026    The following portions of the patient's history were reviewed and updated as appropriate: allergies, current medications, past family history, past medical history, past social history, past surgical history and problem list.  Review of Systems A comprehensive review of systems was negative.   Objective:    BP 117/73   Pulse 76   Ht 5\' 1"  (1.549 m)   Wt 192 lb (87.1 kg)   LMP 01/25/2018   SpO2 100%   BMI 36.28 kg/m  General appearance: alert, cooperative and appears stated age Head: Normocephalic, without obvious abnormality, atraumatic Eyes: conj clear, EOMI, PEERLA Ears: normal TM's and external ear canals both ears Nose: Nares normal. Septum midline. Mucosa normal. No drainage or sinus tenderness. Throat: lips, mucosa, and tongue normal; teeth and gums normal Neck: no adenopathy, no carotid bruit, no JVD, supple, symmetrical, trachea midline and thyroid not enlarged, symmetric, no tenderness/mass/nodules Back: symmetric, no curvature. ROM normal. No CVA tenderness. Lungs: clear to auscultation bilaterally Breasts: normal appearance, no masses or tenderness Heart: regular rate and rhythm, S1, S2 normal, no murmur, click, rub or gallop Abdomen:  soft, non-tender; bowel sounds normal; no masses,  no organomegaly Pelvic: cervix normal in appearance, external genitalia normal, no adnexal masses or tenderness, no cervical motion tenderness, rectovaginal septum normal, uterus normal size, shape, and consistency and vagina normal without discharge Extremities: extremities normal, atraumatic, no cyanosis or edema Pulses: 2+ and symmetric Skin: Skin color, texture, turgor normal. No rashes or lesions Lymph nodes: Cervical, supraclavicular, and axillary nodes normal. Neurologic: Alert and oriented X 3, normal strength and tone. Normal symmetric reflexes. Normal coordination and gait    Assessment:    Healthy female exam.      Plan:     See After Visit Summary for Counseling Recommendations   Keep up a regular exercise program and make sure you are eating a healthy diet Try to eat 4 servings of dairy a day, or if you are lactose intolerant take a calcium with vitamin D daily.  Your vaccines are up to date.  Pap performed today. Mammogram performed today.

## 2018-02-14 ENCOUNTER — Encounter: Payer: Self-pay | Admitting: Family Medicine

## 2018-02-15 LAB — CYTOLOGY - PAP
Diagnosis: NEGATIVE
HPV: NOT DETECTED

## 2018-02-15 NOTE — Progress Notes (Signed)
Call patient: Your Pap smear is normal. Repeat in 5 years.

## 2018-02-23 ENCOUNTER — Encounter: Payer: Self-pay | Admitting: Family Medicine

## 2018-02-24 ENCOUNTER — Encounter: Payer: Self-pay | Admitting: Family Medicine

## 2018-04-15 DIAGNOSIS — G4733 Obstructive sleep apnea (adult) (pediatric): Secondary | ICD-10-CM | POA: Diagnosis not present

## 2018-05-11 ENCOUNTER — Ambulatory Visit: Payer: BLUE CROSS/BLUE SHIELD | Admitting: Family Medicine

## 2018-05-27 IMAGING — DX DG WRIST COMPLETE 3+V*R*
4 series · 4 of 4 positions shown · non-contrast
Comparison: None.

CLINICAL DATA: Right wrist pain x several weeks. No injury.

EXAM:
RIGHT WRIST - COMPLETE 3+ VIEW

[wrist pa]
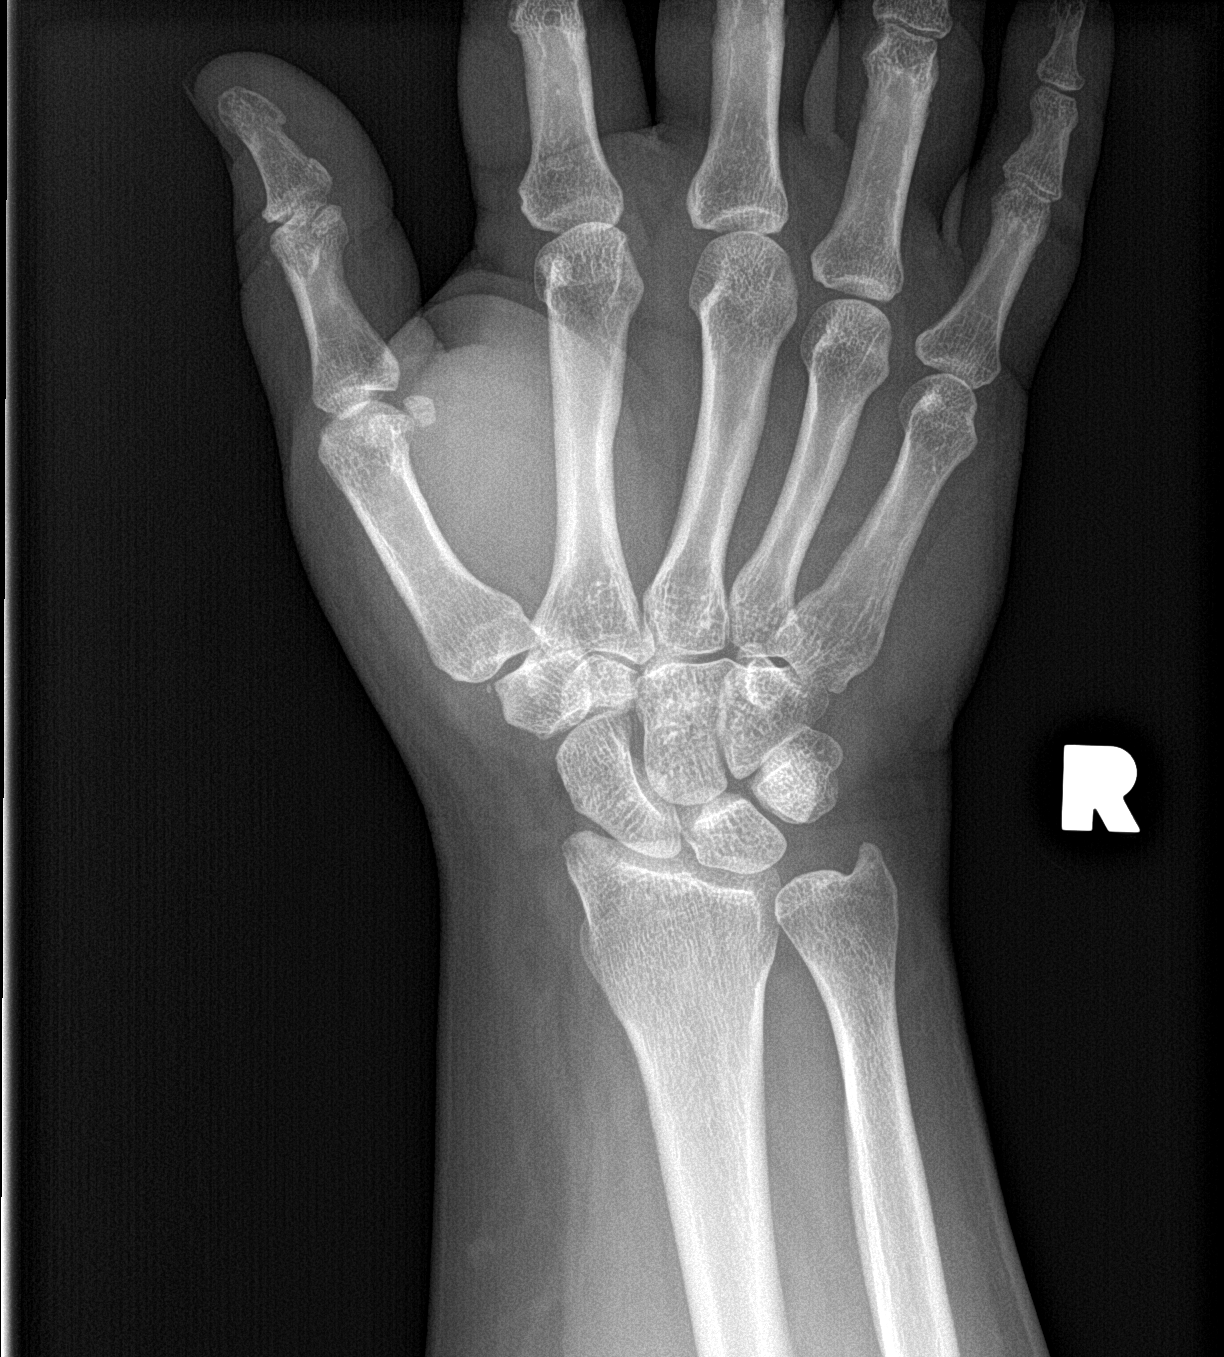

[wrist obl]
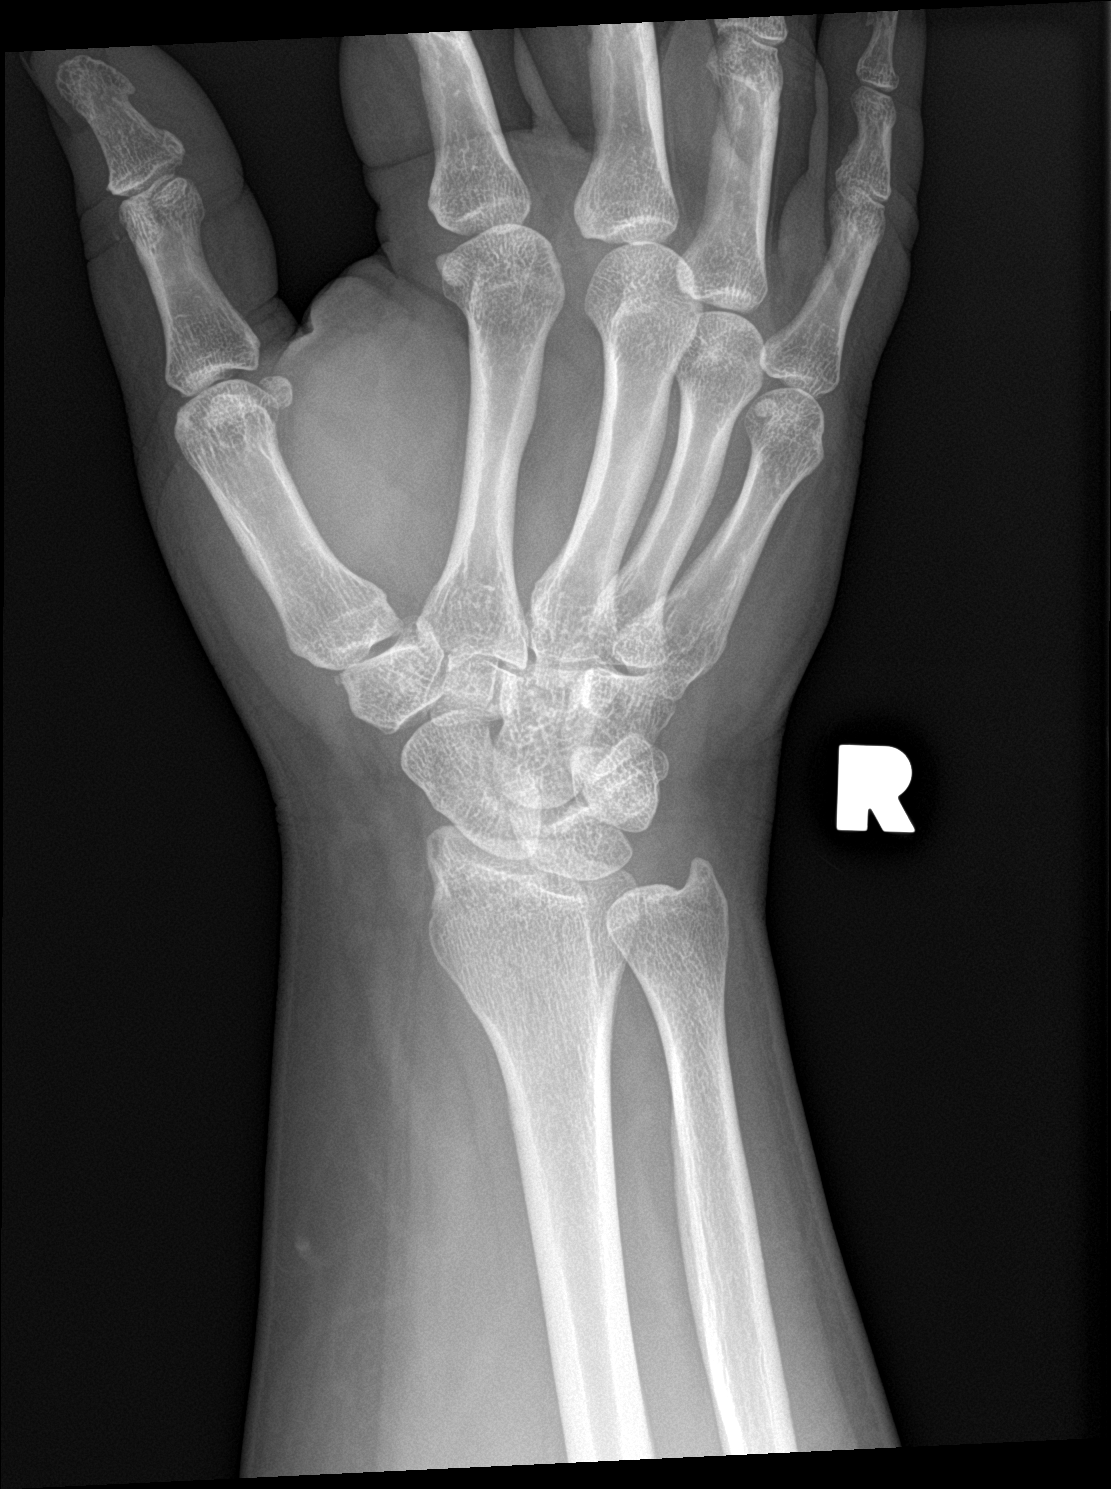

[wrist lat]
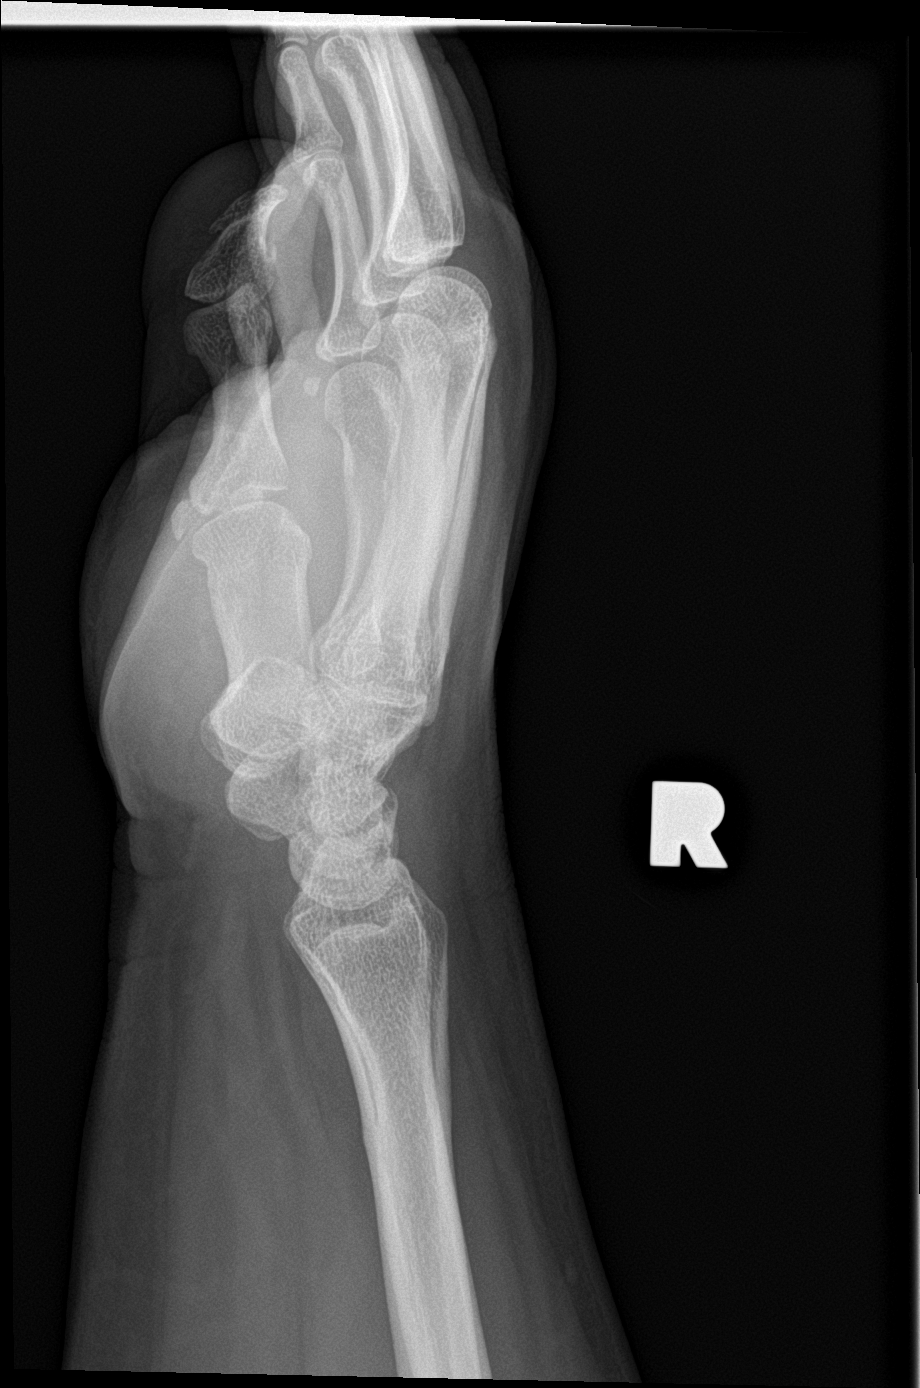

[wrist navicular]
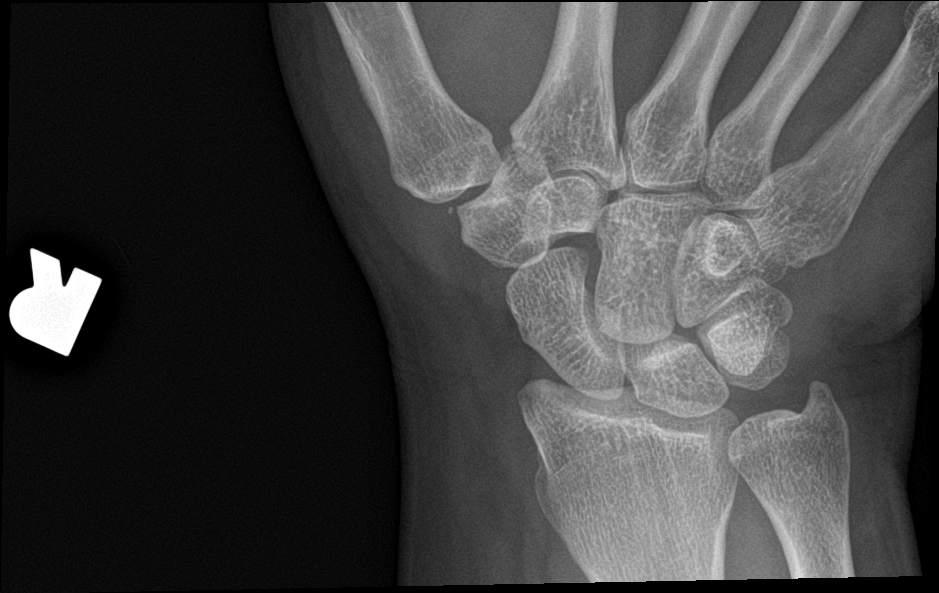

[4 of 4 positions shown; findings below may reference images not displayed]

FINDINGS: There is no evidence of fracture or dislocation. There is no
evidence of arthropathy or other focal bone abnormality. Soft
tissues are unremarkable.
IMPRESSION: Negative.

## 2018-07-17 ENCOUNTER — Other Ambulatory Visit: Payer: Self-pay | Admitting: Family Medicine

## 2018-07-26 DIAGNOSIS — E669 Obesity, unspecified: Secondary | ICD-10-CM | POA: Diagnosis not present

## 2018-07-26 DIAGNOSIS — G4733 Obstructive sleep apnea (adult) (pediatric): Secondary | ICD-10-CM | POA: Diagnosis not present

## 2018-07-26 DIAGNOSIS — G4710 Hypersomnia, unspecified: Secondary | ICD-10-CM | POA: Diagnosis not present

## 2018-08-30 DIAGNOSIS — Z713 Dietary counseling and surveillance: Secondary | ICD-10-CM | POA: Diagnosis not present

## 2018-08-30 DIAGNOSIS — E669 Obesity, unspecified: Secondary | ICD-10-CM | POA: Diagnosis not present

## 2018-10-17 ENCOUNTER — Other Ambulatory Visit: Payer: Self-pay | Admitting: Family Medicine

## 2018-10-17 ENCOUNTER — Encounter: Payer: Self-pay | Admitting: Family Medicine

## 2018-10-27 ENCOUNTER — Ambulatory Visit (INDEPENDENT_AMBULATORY_CARE_PROVIDER_SITE_OTHER): Payer: BC Managed Care – PPO

## 2018-10-27 ENCOUNTER — Other Ambulatory Visit: Payer: Self-pay

## 2018-10-27 ENCOUNTER — Ambulatory Visit (INDEPENDENT_AMBULATORY_CARE_PROVIDER_SITE_OTHER): Payer: BC Managed Care – PPO | Admitting: Family Medicine

## 2018-10-27 VITALS — BP 109/68 | HR 73 | Temp 98.1°F | Wt 195.0 lb

## 2018-10-27 DIAGNOSIS — M1712 Unilateral primary osteoarthritis, left knee: Secondary | ICD-10-CM | POA: Diagnosis not present

## 2018-10-27 DIAGNOSIS — M25461 Effusion, right knee: Secondary | ICD-10-CM | POA: Diagnosis not present

## 2018-10-27 DIAGNOSIS — M25561 Pain in right knee: Secondary | ICD-10-CM | POA: Diagnosis not present

## 2018-10-27 MED ORDER — DICLOFENAC SODIUM 1 % TD GEL: 4.0000 g | Freq: Four times a day (QID) | TRANSDERMAL | 11 refills | Status: DC

## 2018-10-27 NOTE — Patient Instructions (Signed)
Thank you for coming in today. Use the topical medicine for pain up to 4x daily.  We should be proceeding to MRI.  You should hear about scheduling soon.  Let me know if you do not hear anything  Based on results we may have you schedule in office visit or possible virtual visit.    Meniscus Tear  A meniscus tear is a knee injury that happens when a piece of the meniscus is torn. The meniscus is a thick, rubbery, wedge-shaped cartilage in the knee. Two menisci are located in each knee. They sit between the upper bone (femur) and lower bone (tibia) that make up the knee joint. Each meniscus acts as a shock absorber for the knee. A torn meniscus is one of the most common types of knee injuries. This injury can range from mild to severe. Surgery may be needed to repair a severe tear. What are the causes? This condition may be caused by any kneeling, squatting, twisting, or pivoting movement. Sports-related injuries are the most common cause. These often occur from:  Running and stopping suddenly. ? Changing direction. ? Being tackled or knocked off your feet.  Lifting or carrying heavy weights. As people get older, their menisci get thinner and weaker. In these people, tears can happen more easily, such as from climbing stairs. What increases the risk? You are more likely to develop this condition if you:  Play contact sports.  Have a job that requires kneeling or squatting.  Are female.  Are over 80 years old. What are the signs or symptoms? Symptoms of this condition include:  Knee pain, especially at the side of the knee joint. You may feel pain when the injury occurs, or you may only hear a pop and feel pain later.  A feeling that your knee is clicking, catching, locking, or giving way.  Not being able to fully bend or extend your knee.  Bruising or swelling in your knee. How is this diagnosed? This condition may be diagnosed based on your symptoms and a physical exam. You  may also have tests, such as:  X-rays.  MRI.  A procedure to look inside your knee with a narrow surgical telescope (arthroscopy). You may be referred to a knee specialist (orthopedic surgeon). How is this treated? Treatment for this injury depends on the severity of the tear. Treatment for a mild tear may include:  Rest.  Medicine to reduce pain and swelling. This is usually a nonsteroidal anti-inflammatory drug (NSAID), like ibuprofen.  A knee brace, sleeve, or wrap.  Using crutches or a walker to keep weight off your knee and to help you walk.  Exercises to strengthen your knee (physical therapy). You may need surgery if you have a severe tear or if other treatments are not working. Follow these instructions at home: If you have a brace, sleeve, or wrap:  Wear it as told by your health care provider. Remove it only as told by your health care provider.  Loosen the brace, sleeve, or wrap if your toes tingle, become numb, or turn cold and blue.  Keep the brace, sleeve, or wrap clean and dry.  If the brace, sleeve, or wrap is not waterproof: ? Do not let it get wet. ? Cover it with a watertight covering when you take a bath or shower. Managing pain and swelling   Take over-the-counter and prescription medicines only as told by your health care provider.  If directed, put ice on your knee: ? If you  have a removable brace, sleeve, or wrap, remove it as told by your health care provider. ? Put ice in a plastic bag. ? Place a towel between your skin and the bag. ? Leave the ice on for 20 minutes, 2-3 times per day.  Move your toes often to avoid stiffness and to lessen swelling.  Raise (elevate) the injured area above the level of your heart while you are sitting or lying down. Activity  Do not use the injured limb to support your body weight until your health care provider says that you can. Use crutches or a walker as told by your health care provider.  Return to your  normal activities as told by your health care provider. Ask your health care provider what activities are safe for you.  Perform range-of-motion exercises only as told by your health care provider.  Begin doing exercises to strengthen your knee and leg muscles only as told by your health care provider. After you recover, your health care provider may recommend these exercises to help prevent another injury. General instructions  Use a knee brace, sleeve, or wrap as told by your health care provider.  Ask your health care provider when it is safe to drive if you have a brace, sleeve, or wrap on your knee.  Do not use any products that contain nicotine or tobacco, such as cigarettes, e-cigarettes, and chewing tobacco. If you need help quitting, ask your health care provider.  Ask your health care provider if the medicine prescribed to you: ? Requires you to avoid driving or using heavy machinery. ? Can cause constipation. You may need to take these actions to prevent or treat constipation:  Drink enough fluid to keep your urine pale yellow.  Take over-the-counter or prescription medicines.  Eat foods that are high in fiber, such as beans, whole grains, and fresh fruits and vegetables.  Limit foods that are high in fat and processed sugars, such as fried or sweet foods.  Keep all follow-up visits as told by your health care provider. This is important. Contact a health care provider if:  You have a fever.  Your knee becomes red, tender, or swollen.  Your pain medicine is not helping.  Your symptoms get worse or do not improve after 2 weeks of home care. Summary  A meniscus tear is a knee injury that happens when a piece of the meniscus is torn.  Treatment for this injury depends on the severity of the tear. You may need surgery if you have a severe tear or if other treatments are not working.  Rest, ice, and raise (elevate) your injured knee as told by your health care provider.  This will help lessen pain and swelling.  Contact a health care provider if you have new symptoms, or your symptoms get worse or do not improve after 2 weeks of home care.  Keep all follow-up visits as told by your health care provider. This is important. This information is not intended to replace advice given to you by your health care provider. Make sure you discuss any questions you have with your health care provider. Document Released: 04/25/2002 Document Revised: 08/17/2017 Document Reviewed: 08/17/2017 Elsevier Patient Education  2020 [employer].

## 2018-10-27 NOTE — Progress Notes (Signed)
Tonya Phillips is a 48 y.o. female who presents to [facility] today for right knee pain.  Patient was in her normal state of health about a month ago.  She was exercising and doing a lunge.  Her left knee was forward and her right knee was back.  She landed somewhat awkwardly and felt her right knee twist and pop.  She developed pain and swelling shortly thereafter.  Since then she has had persistent pain and swelling in the right knee.  She notes occasional locking and catching.  She notes frequent popping.  Her knee became locked this weekend requiring a twist to get it unlocked and extend normally.  She is not been able to exercise normally because of her symptoms over the last month.  She is tried rest and some home exercises which have not helped.  She denies any history of knee injury previously.  She cannot take oral NSAIDs due to.  Check surgery status.    ROS:  As above  Exam:  BP 109/68   Pulse 73   Temp 98.1 F (36.7 C) (Oral)   Wt 195 lb (88.5 kg)   SpO2 95%   BMI 36.84 kg/m  Wt Readings from Last 5 Encounters:  10/27/18 195 lb (88.5 kg)  02/11/18 192 lb (87.1 kg)  01/10/18 196 lb (88.9 kg)  10/19/17 194 lb (88 kg)  09/23/17 194 lb 14.4 oz (88.4 kg)   General: Well Developed, well nourished, and in no acute distress.  Neuro/Psych: Alert and oriented x3, extra-ocular muscles intact, able to move all 4 extremities, sensation grossly intact. Skin: Warm and dry, no rashes noted.  Respiratory: Not using accessory muscles, speaking in full sentences, trachea midline.  Cardiovascular: Pulses palpable, no extremity edema. Abdomen: Does not appear distended. MSK: Right knee: Mild effusion otherwise normal-appearing with no deformity. Range of motion 0-100 degrees with minimal crepitations. Tender palpation medial joint line. Negative anterior posterior drawer test. No laxity with valgus or varus stress test however some pain with  MCL stress without laxity. Positive medial McMurray's test negative lateral. Intact strength to extension and flexion.  Contralateral left knee normal-appearing normal motion normal strength negative ligament or meniscus exam testing.    Lab and Radiology Results X-ray images right knee obtained today personally independently reviewed. Mild degenerative changes present medial compartment.  Minimal degenerative changes patellofemoral space.  No acute fractures. Await formal radiology review    Assessment and Plan: 48 y.o. female with right medial knee pain with mechanical symptoms.  Occurring about 1 month ago with injury.  Exam and history highly concerning for medial meniscus tear.  Given significant mechanical symptoms including locking and significant impairment of quality of life at this point reasonable to proceed with MRI for potential surgical or injection planning.  Additionally use diclofenac gel for pain control in the interim.  Recheck following MRI.  If surgery obviously needed patient okay just to be scheduled with surgery without in person visit.   PDMP not reviewed this encounter. Orders Placed This Encounter  Procedures  . DG Knee Complete 4 Views Right    Please include patellar sunrise, lateral, and weightbearing bilateral AP and bilateral rosenberg views    Standing Status:   Future    Standing Expiration Date:   12/27/2019    Order Specific Question:   Reason for exam:    Answer:   Please include patellar sunrise, lateral, and weightbearing bilateral AP and bilateral rosenberg views  Comments:   Please include patellar sunrise, lateral, and weightbearing bilateral AP and bilateral rosenberg views    Order Specific Question:   Preferred imaging location?    Answer:   [facility]  . DG Knee 1-2 Views Left    Standing Status:   Future    Standing Expiration Date:   12/28/2019    Order Specific Question:   Reason for Exam (SYMPTOM  OR DIAGNOSIS REQUIRED)     Answer:   For use with right knee x-ray, bilateral AP and Rosenberg standing.    Order Specific Question:   Is the patient pregnant?    Answer:   No    Order Specific Question:   Preferred imaging location?    Answer:   [facility]   No orders of the defined types were placed in this encounter.   Historical information moved to improve visibility of documentation.  Past Medical History:  Diagnosis Date  . Right carpal tunnel syndrome    EMG scanned in  . Suicide attempt [facility]) 2011   Past Surgical History:  Procedure Laterality Date  . CHOLECYSTECTOMY    . NASAL SEPTUM SURGERY     Social History   Tobacco Use  . Smoking status: Never Smoker  . Smokeless tobacco: Never Used  Substance Use Topics  . Alcohol use: Yes    Alcohol/week: 0.0 standard drinks    Comment: occas   family history includes Alcoholism in her paternal grandfather; Hypertension in her brother.  Medications: Current Outpatient Medications  Medication Sig Dispense Refill  . AMBULATORY NON FORMULARY MEDICATION Medication Name: CPAP, humidifier and supplies set to 12 cm water pressure. . Dx OSA. Aerocare. 1 vial 0  . montelukast (SINGULAIR) 10 MG tablet TAKE 1 TABLET BY MOUTH EVERYDAY AT BEDTIME 90 tablet 1  . omeprazole (PRILOSEC) 20 MG capsule Take 20 mg by mouth 2 (two) times daily.    Marland Kitchen venlafaxine XR (EFFEXOR-XR) 150 MG 24 hr capsule TAKE 1 CAPSULE BY MOUTH EVERY DAY 90 capsule 0  . Phentermine-Topiramate (QSYMIA) 3.75-23 MG CP24 Take 1 capsule by mouth daily.     No current facility-administered medications for this visit.    No Known Allergies    Discussed warning signs or symptoms. Please see discharge instructions. Patient expresses understanding.

## 2018-10-28 ENCOUNTER — Encounter: Payer: Self-pay | Admitting: Family Medicine

## 2018-11-01 ENCOUNTER — Other Ambulatory Visit: Payer: Self-pay

## 2018-11-01 ENCOUNTER — Ambulatory Visit (INDEPENDENT_AMBULATORY_CARE_PROVIDER_SITE_OTHER): Payer: BC Managed Care – PPO

## 2018-11-01 DIAGNOSIS — M25561 Pain in right knee: Secondary | ICD-10-CM | POA: Diagnosis not present

## 2018-11-02 ENCOUNTER — Encounter: Payer: Self-pay | Admitting: Family Medicine

## 2018-11-03 ENCOUNTER — Encounter: Payer: Self-pay | Admitting: Family Medicine

## 2019-01-13 DIAGNOSIS — K912 Postsurgical malabsorption, not elsewhere classified: Secondary | ICD-10-CM | POA: Diagnosis not present

## 2019-01-13 DIAGNOSIS — Z903 Acquired absence of stomach [part of]: Secondary | ICD-10-CM | POA: Diagnosis not present

## 2019-01-13 LAB — HEMOGLOBIN A1C: Hemoglobin A1C: 5.1

## 2019-01-16 ENCOUNTER — Other Ambulatory Visit: Payer: Self-pay | Admitting: Family Medicine

## 2019-01-16 ENCOUNTER — Encounter: Payer: Self-pay | Admitting: Family Medicine

## 2019-01-16 MED ORDER — VENLAFAXINE HCL ER 150 MG PO CP24: ORAL_CAPSULE | ORAL | 1 refills | Status: DC

## 2019-01-16 NOTE — Telephone Encounter (Signed)
Sent to pharmacy 

## 2019-01-16 NOTE — Telephone Encounter (Signed)
Needs directions and quantity to dispense

## 2019-01-19 DIAGNOSIS — Z903 Acquired absence of stomach [part of]: Secondary | ICD-10-CM | POA: Diagnosis not present

## 2019-01-19 DIAGNOSIS — K912 Postsurgical malabsorption, not elsewhere classified: Secondary | ICD-10-CM | POA: Diagnosis not present

## 2019-05-30 DIAGNOSIS — Z03818 Encounter for observation for suspected exposure to other biological agents ruled out: Secondary | ICD-10-CM | POA: Diagnosis not present

## 2019-05-30 DIAGNOSIS — Z20828 Contact with and (suspected) exposure to other viral communicable diseases: Secondary | ICD-10-CM | POA: Diagnosis not present

## 2019-06-01 DIAGNOSIS — Z03818 Encounter for observation for suspected exposure to other biological agents ruled out: Secondary | ICD-10-CM | POA: Diagnosis not present

## 2019-06-01 DIAGNOSIS — Z20828 Contact with and (suspected) exposure to other viral communicable diseases: Secondary | ICD-10-CM | POA: Diagnosis not present

## 2019-07-16 ENCOUNTER — Other Ambulatory Visit: Payer: Self-pay | Admitting: Family Medicine

## 2019-07-24 ENCOUNTER — Encounter: Payer: Self-pay | Admitting: Family Medicine

## 2019-07-24 ENCOUNTER — Telehealth: Payer: Self-pay

## 2019-07-24 MED ORDER — VENLAFAXINE HCL ER 150 MG PO CP24: 150.0000 mg | ORAL_CAPSULE | Freq: Every day | ORAL | 0 refills | Status: DC

## 2019-07-24 NOTE — Telephone Encounter (Signed)
Phone note open

## 2019-07-24 NOTE — Telephone Encounter (Signed)
Tonya Phillips has been scheduled for a virtual follow up on June 14 th. A 30 day supply has been sent to [facility]. She states her insurance will only pay for a 90 day. I did advise we could send a 30 day supply to [facility] with a GoodRx coupon for $11. She was not agreeable to this recommendation. She would like a 90 day supply. Is it ok to give 90 day refill?

## 2019-07-31 ENCOUNTER — Encounter: Payer: Self-pay | Admitting: Family Medicine

## 2019-07-31 ENCOUNTER — Telehealth (INDEPENDENT_AMBULATORY_CARE_PROVIDER_SITE_OTHER): Payer: BC Managed Care – PPO | Admitting: Family Medicine

## 2019-07-31 DIAGNOSIS — E282 Polycystic ovarian syndrome: Secondary | ICD-10-CM | POA: Diagnosis not present

## 2019-07-31 DIAGNOSIS — Z1159 Encounter for screening for other viral diseases: Secondary | ICD-10-CM

## 2019-07-31 DIAGNOSIS — F418 Other specified anxiety disorders: Secondary | ICD-10-CM | POA: Diagnosis not present

## 2019-07-31 DIAGNOSIS — N912 Amenorrhea, unspecified: Secondary | ICD-10-CM | POA: Diagnosis not present

## 2019-07-31 MED ORDER — VENLAFAXINE HCL ER 150 MG PO CP24: 150.0000 mg | ORAL_CAPSULE | Freq: Every day | ORAL | 2 refills | Status: DC

## 2019-07-31 NOTE — Progress Notes (Signed)
Virtual Visit via Video Note  I connected with Tonya Phillips on 07/31/19 at  7:10 AM EDT by a video enabled telemedicine application and verified that I am speaking with the correct person using two identifiers.   I discussed the limitations of evaluation and management by telemedicine and the availability of in person appointments. The patient expressed understanding and agreed to proceed.  Patient: at home Provider: at the office     Beatrice Lecher, MD    Established Patient Office Visit  Subjective:  Patient ID: Tonya Phillips, female    DOB: [birth year]  Age: 49 y.o. MRN: [id number]  CC:  Chief Complaint  Patient presents with  . Depression  . Anxiety    HPI Tonya Phillips presents for F/u depression and Anxiety. She started college classesaagain. In the last month she has been waking up and having a hard time going to to sleep.   LMP was about 4 month and getting hot flashes. Wonders if could ben starting to go into menopause.    She says her friend from [facility] won't be moving her anytime soon.  Has been dx with Cancer.   Past Medical History:  Diagnosis Date  . Right carpal tunnel syndrome    EMG scanned in  . Suicide attempt [facility]) 2011    Past Surgical History:  Procedure Laterality Date  . CHOLECYSTECTOMY    . NASAL SEPTUM SURGERY      Family History  Problem Relation Age of Onset  . Alcoholism Paternal Grandfather   . Hypertension Brother     Social History   Socioeconomic History  . Marital status: Divorced    Spouse name: Not on file  . Number of children: 0  . Years of education: college  . Highest education level: Not on file  Occupational History  . 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   - degree in [place].   Tobacco Use  . Smoking status: Never Smoker  . Smokeless tobacco: Never Used  Substance and Sexual Activity  . Alcohol use: Yes    Alcohol/week: 0.0 standard drinks    Comment: occas  . Drug  use: No  . Sexual activity: Not Currently  Other Topics Concern  . 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 Her parents live with her.   Social Determinants of Health   Financial Resource Strain:   . Difficulty of Paying Living Expenses:   Food Insecurity:   . Worried About Charity fundraiser in the Last Year:   . Arboriculturist in the Last Year:   Transportation Needs:   . Film/video editor (Medical):   Marland Kitchen Lack of Transportation (Non-Medical):   Physical Activity:   . Days of Exercise per Week:   . Minutes of Exercise per Session:   Stress:   . Feeling of Stress :   Social Connections:   . Frequency of Communication with Friends and Family:   . Frequency of Social Gatherings with Friends and Family:   . Attends Religious Services:   . Active Member of Clubs or Organizations:   . Attends Archivist Meetings:   Marland Kitchen Marital Status:   Intimate Partner Violence:   . Fear of Current or Ex-Partner:   . Emotionally Abused:   Marland Kitchen Physically Abused:   . Sexually Abused:     Outpatient Medications  Prior to Visit  Medication Sig Dispense Refill  . AMBULATORY NON FORMULARY MEDICATION Medication Name: CPAP, humidifier and supplies set to 12 cm water pressure. . Dx OSA. [employer]. 1 vial 0  . omeprazole (PRILOSEC) 20 MG capsule Take 20 mg by mouth daily.     Marland Kitchen venlafaxine XR (EFFEXOR-XR) 150 MG 24 hr capsule Take 1 capsule (150 mg total) by mouth daily. 90 capsule 0  . diclofenac sodium (VOLTAREN) 1 % GEL Apply 4 g topically 4 (four) times daily. To affected joint. 100 g 11  . montelukast (SINGULAIR) 10 MG tablet TAKE 1 TABLET BY MOUTH EVERYDAY AT BEDTIME 90 tablet 1   No facility-administered medications prior to visit.    No Known Allergies  ROS Review of Systems    Objective:    Physical Exam Vitals reviewed.  Constitutional:      Appearance: She is well-developed.  HENT:     Head: Normocephalic and  atraumatic.  Eyes:     Conjunctiva/sclera: Conjunctivae normal.  Pulmonary:     Effort: Pulmonary effort is normal.  Skin:    General: Skin is dry.     Coloration: Skin is not pale.  Neurological:     Mental Status: She is alert and oriented to person, place, and time.  Psychiatric:        Behavior: Behavior normal.     There were no vitals taken for this visit. Wt Readings from Last 3 Encounters:  10/27/18 195 lb (88.5 kg)  02/11/18 192 lb (87.1 kg)  01/10/18 196 lb (88.9 kg)     Health Maintenance Due  Topic Date Due  . Hepatitis C Screening  Never done    There are no preventive care reminders to display for this patient.  Lab Results  Component Value Date   TSH 1.51 04/13/2016   Lab Results  Component Value Date   WBC 9.6 12/31/2017   HGB 14.3 12/31/2017   HCT 42 12/31/2017   MCV 86.5 10/02/2014   PLT 259 10/02/2014   Lab Results  Component Value Date   NA 143 12/31/2017   K 4.8 12/31/2017   CO2 26 04/13/2016   GLUCOSE 85 04/13/2016   BUN 16 12/31/2017   CREATININE 0.7 12/31/2017   BILITOT 0.6 04/13/2016   ALKPHOS 61 12/31/2017   AST 19 12/31/2017   ALT 19 12/31/2017   PROT 7.2 04/13/2016   ALBUMIN 4.2 04/13/2016   CALCIUM 9.3 04/13/2016   Lab Results  Component Value Date   CHOL 223 (A) 12/31/2017   Lab Results  Component Value Date   HDL 42 12/31/2017   Lab Results  Component Value Date   LDLCALC 153 12/31/2017   Lab Results  Component Value Date   TRIG 140 12/31/2017   Lab Results  Component Value Date   CHOLHDL 4.9 04/13/2016   Lab Results  Component Value Date   HGBA1C 5.2 01/01/2018      Assessment & Plan:   Problem List Items Addressed This Visit      Endocrine   PCOS (polycystic ovarian syndrome)    Due for A1C.        Relevant Orders   Estradiol   Follicle stimulating hormone   Luteinizing hormone   Progesterone   TSH   Hemoglobin A1c     Other   Depression with anxiety - Primary    Will continue  current regimen.  Due for Effexor-XR RF.  Would like to continue current regimen.  Relevant Medications   venlafaxine XR (EFFEXOR-XR) 150 MG 24 hr capsule    Other Visit Diagnoses    Amenorrhea       Relevant Orders   Estradiol   Follicle stimulating hormone   Luteinizing hormone   Progesterone   TSH   Hemoglobin A1c   Hepatitis C Antibody   Encounter for hepatitis C screening test for low risk patient       Relevant Orders   Hepatitis C Antibody     Amenorrhea - will check for menopause. Can discuss tx of symptoms.   Meds ordered this encounter  Medications  . venlafaxine XR (EFFEXOR-XR) 150 MG 24 hr capsule    Sig: Take 1 capsule (150 mg total) by mouth daily.    Dispense:  90 capsule    Refill:  2    Follow-up: No follow-ups on file.   Time spent in encounter 20 minutes  I discussed the assessment and treatment plan with the patient. The patient was provided an opportunity to ask questions and all were answered. The patient agreed with the plan and demonstrated an understanding of the instructions.   The patient was advised to call back or seek an in-person evaluation if the symptoms worsen or if the condition fails to improve as anticipated  Beatrice Lecher, MD

## 2019-07-31 NOTE — Assessment & Plan Note (Signed)
Will continue current regimen.  Due for Effexor-XR RF.  Would like to continue current regimen.

## 2019-07-31 NOTE — Assessment & Plan Note (Signed)
Due for A1C  

## 2019-08-07 DIAGNOSIS — E669 Obesity, unspecified: Secondary | ICD-10-CM | POA: Diagnosis not present

## 2019-08-07 DIAGNOSIS — G4733 Obstructive sleep apnea (adult) (pediatric): Secondary | ICD-10-CM | POA: Diagnosis not present

## 2019-08-07 DIAGNOSIS — G4710 Hypersomnia, unspecified: Secondary | ICD-10-CM | POA: Diagnosis not present

## 2019-08-23 ENCOUNTER — Encounter: Payer: Self-pay | Admitting: Family Medicine

## 2019-10-11 ENCOUNTER — Telehealth (INDEPENDENT_AMBULATORY_CARE_PROVIDER_SITE_OTHER): Payer: BC Managed Care – PPO | Admitting: Physician Assistant

## 2019-10-11 ENCOUNTER — Encounter: Payer: Self-pay | Admitting: Family Medicine

## 2019-10-11 ENCOUNTER — Other Ambulatory Visit: Payer: Self-pay

## 2019-10-11 VITALS — Ht 61.0 in | Wt 195.0 lb

## 2019-10-11 DIAGNOSIS — G43009 Migraine without aura, not intractable, without status migrainosus: Secondary | ICD-10-CM

## 2019-10-11 MED ORDER — PROMETHAZINE HCL 25 MG PO TABS: 25.0000 mg | ORAL_TABLET | Freq: Four times a day (QID) | ORAL | 2 refills | Status: DC | PRN

## 2019-10-11 MED ORDER — DEXAMETHASONE SODIUM PHOSPHATE 10 MG/ML IJ SOLN
8.0000 mg | Freq: Once | INTRAMUSCULAR | Status: AC
  Administered 2019-10-11: 8 mg via INTRAVENOUS

## 2019-10-11 MED ORDER — KETOROLAC TROMETHAMINE 60 MG/2ML IM SOLN
60.0000 mg | Freq: Once | INTRAMUSCULAR | Status: AC
  Administered 2019-10-11: 60 mg via INTRAMUSCULAR

## 2019-10-11 MED ORDER — RIZATRIPTAN BENZOATE 10 MG PO TABS: 10.0000 mg | ORAL_TABLET | ORAL | 0 refills | Status: DC | PRN

## 2019-10-11 NOTE — Progress Notes (Signed)
Patient ID: Tonya Phillips, female   DOB: [birth year], 49 y.o.   MRN: [id number] .Marland KitchenVirtual Visit via Video Note  I connected with Tonya Phillips on 10/11/2019 at  2:20 PM EDT by a video enabled telemedicine application and verified that I am speaking with the correct person using two identifiers.  Location: Patient: home Provider: clinic   I discussed the limitations of evaluation and management by telemedicine and the availability of in person appointments. The patient expressed understanding and agreed to proceed.  History of Present Illness: Patient is a 49 year old obese female with OSA, chronic rhinitis, HLD, GERD, PCOS who calls into the clinic with worsening headaches.  Patient has noticed over the last 6 weeks her migraines/headaches increase in intensity.  She is to have them every other week now she is having at least 1 migraine a week but last at least 1 to 2 days.  She describes the headaches more like sinus pressure, eye pain, cheek pain that cause light sensitivity nausea and vomiting.  She denies any dizziness or vision changes.  She does have allergies and chronic rhinitis and takes Claritin daily.  She does report all symptoms do resolve before the next round of migraine.  Ice packs on head and sleep tend to help the most.  She does not get a lot of relief from over-the-counter Advil Motrin Excedrin.  She denies any new medication changes.  This last headache is going on for 2 days.  She is very nauseated and vomited this morning.  .. Active Ambulatory Problems    Diagnosis Date Noted  . PCOS (polycystic ovarian syndrome) 09/04/2014  . OSA on CPAP 09/04/2014  . Chronic rhinitis 09/19/2014  . Sleep apnea 09/19/2014  . Osteoarthritis of right knee 09/19/2014  . Right ankle pain 10/02/2014  . Carpal tunnel syndrome 10/02/2014  . Wrist pain, right 02/08/2015  . Depression with anxiety 11/22/2015  . HLD (hyperlipidemia) 09/22/2016  . NAFLD (nonalcoholic fatty liver disease) 12/22/2016   . Gastroesophageal reflux disease 11/16/2016  . Morbid obesity with BMI of 45.0-49.9, adult ([place]) 12/22/2016   Resolved Ambulatory Problems    Diagnosis Date Noted  . Controlled type 2 diabetes mellitus without complication, without long-term current use of insulin ([place]) 11/22/2015  . De Quervain's tenosynovitis, right 01/30/2016   Past Medical History:  Diagnosis Date  . Right carpal tunnel syndrome   . Suicide attempt [facility]) 2011   Reviewed med, allergy, problem list.    Observations/Objective: No acute distress Normal breathing.  Norma mood and appearance.   .. Today's Vitals   10/11/19 1312  Weight: 195 lb (88.5 kg)  Height: 5\' 1"  (1.549 m)   Body mass index is 36.84 kg/m.    Assessment and Plan: Marland KitchenMarland KitchenSamara was seen today for migraine.  Diagnoses and all orders for this visit:  Migraine without aura and without status migrainosus, not intractable -     rizatriptan (MAXALT) 10 MG tablet; Take 1 tablet (10 mg total) by mouth as needed for migraine. May repeat in 2 hours if needed -     promethazine (PHENERGAN) 25 MG tablet; Take 1 tablet (25 mg total) by mouth every 6 (six) hours as needed for nausea or vomiting. -     dexamethasone (DECADRON) injection 8 mg -     ketorolac (TORADOL) injection 60 mg    Pt will come to the clinic for migraine cocktail of toradol 60mg  and decadron 8mg .   Migraine cocktail should remedy headache for today.  She is having  on average for a month.  I would like to try a better rescue to see if this helps.  Maxalt sent to the pharmacy.  Strongly encouraged to keep a headache diary.  To see if there are any triggers.  Continue on Claritin daily and add Flonase daily.  Since she does have a history of allergies and going into ragweed season there could be some triggers here.  Follow-up with PCP in 4 weeks.  Follow Up Instructions:    I discussed the assessment and treatment plan with the patient. The patient was provided an opportunity to  ask questions and all were answered. The patient agreed with the plan and demonstrated an understanding of the instructions.   The patient was advised to call back or seek an in-person evaluation if the symptoms worsen or if the condition fails to improve as anticipated.  I provided 11 minutes of non-face-to-face time during this encounter.   Iran Planas, PA-C

## 2019-10-13 ENCOUNTER — Encounter: Payer: Self-pay | Admitting: Physician Assistant

## 2019-10-13 DIAGNOSIS — G43009 Migraine without aura, not intractable, without status migrainosus: Secondary | ICD-10-CM | POA: Insufficient documentation

## 2019-11-07 ENCOUNTER — Other Ambulatory Visit: Payer: Self-pay | Admitting: Physician Assistant

## 2019-11-07 DIAGNOSIS — R519 Headache, unspecified: Secondary | ICD-10-CM | POA: Diagnosis not present

## 2019-11-07 DIAGNOSIS — G43009 Migraine without aura, not intractable, without status migrainosus: Secondary | ICD-10-CM

## 2019-11-07 DIAGNOSIS — Z20822 Contact with and (suspected) exposure to covid-19: Secondary | ICD-10-CM | POA: Diagnosis not present

## 2019-11-11 DIAGNOSIS — Z20822 Contact with and (suspected) exposure to covid-19: Secondary | ICD-10-CM | POA: Diagnosis not present

## 2019-12-26 DIAGNOSIS — Z03818 Encounter for observation for suspected exposure to other biological agents ruled out: Secondary | ICD-10-CM | POA: Diagnosis not present

## 2019-12-26 DIAGNOSIS — Z20822 Contact with and (suspected) exposure to covid-19: Secondary | ICD-10-CM | POA: Diagnosis not present

## 2020-01-01 ENCOUNTER — Telehealth (INDEPENDENT_AMBULATORY_CARE_PROVIDER_SITE_OTHER): Payer: BC Managed Care – PPO | Admitting: Physician Assistant

## 2020-01-01 ENCOUNTER — Encounter: Payer: Self-pay | Admitting: Physician Assistant

## 2020-01-01 VITALS — Temp 98.7°F | Ht 61.0 in | Wt 193.0 lb

## 2020-01-01 DIAGNOSIS — J0100 Acute maxillary sinusitis, unspecified: Secondary | ICD-10-CM | POA: Diagnosis not present

## 2020-01-01 DIAGNOSIS — R059 Cough, unspecified: Secondary | ICD-10-CM | POA: Diagnosis not present

## 2020-01-01 DIAGNOSIS — G43009 Migraine without aura, not intractable, without status migrainosus: Secondary | ICD-10-CM | POA: Diagnosis not present

## 2020-01-01 MED ORDER — ALBUTEROL SULFATE HFA 108 (90 BASE) MCG/ACT IN AERS: 2.0000 | INHALATION_SPRAY | Freq: Four times a day (QID) | RESPIRATORY_TRACT | 0 refills | Status: DC | PRN

## 2020-01-01 MED ORDER — AMOXICILLIN-POT CLAVULANATE 875-125 MG PO TABS: 1.0000 | ORAL_TABLET | Freq: Two times a day (BID) | ORAL | 0 refills | Status: DC

## 2020-01-01 MED ORDER — RIZATRIPTAN BENZOATE 10 MG PO TABS: 10.0000 mg | ORAL_TABLET | ORAL | 1 refills | Status: DC | PRN

## 2020-01-01 NOTE — Progress Notes (Signed)
Patient ID: Tonya Phillips, female   DOB: [birth year], 49 y.o.   MRN: [id number] .Marland KitchenVirtual Visit via Telephone Note  I connected with Tonya Phillips on 01/01/20 at 10:30 AM EST by telephone and verified that I am speaking with the correct person using two identifiers.  Location: Patient: home Provider: clinic   I discussed the limitations, risks, security and privacy concerns of performing an evaluation and management service by telephone and the availability of in person appointments. I also discussed with the patient that there may be a patient responsible charge related to this service. The patient expressed understanding and agreed to proceed.   History of Present Illness: Pt is a 49 yo female who calls into the clinic with 10 days of sinus pressure, ST, congestion, headache, ear popping and now cough. She denies any fever, chills, body aches, SOB, loss of smell or taste. No covid known contacts. Not immunized. Taking sinus severe/cold and sinus with dayquil.   maxalt working great for migraine rescue. Needs refills.   .. Active Ambulatory Problems    Diagnosis Date Noted  . PCOS (polycystic ovarian syndrome) 09/04/2014  . OSA on CPAP 09/04/2014  . Chronic rhinitis 09/19/2014  . Sleep apnea 09/19/2014  . Osteoarthritis of right knee 09/19/2014  . Right ankle pain 10/02/2014  . Carpal tunnel syndrome 10/02/2014  . Wrist pain, right 02/08/2015  . Depression with anxiety 11/22/2015  . HLD (hyperlipidemia) 09/22/2016  . NAFLD (nonalcoholic fatty liver disease) 12/22/2016  . Gastroesophageal reflux disease 11/16/2016  . Morbid obesity with BMI of 45.0-49.9, adult ([place]) 12/22/2016  . Migraine without aura and without status migrainosus, not intractable 10/13/2019   Resolved Ambulatory Problems    Diagnosis Date Noted  . Controlled type 2 diabetes mellitus without complication, without long-term current use of insulin ([place]) 11/22/2015  . De Quervain's tenosynovitis, right 01/30/2016    Past Medical History:  Diagnosis Date  . Right carpal tunnel syndrome   . Suicide attempt [facility]) 2011   Reviewed med, allergy, problem list.     Observations/Objective: No acute distress  Normal mood.  No cough or labored breathing.  Tenderness over maxillary sinuses.   .. Today's Vitals   01/01/20 0934  Temp: 98.7 F (37.1 C)  TempSrc: Oral  Weight: 193 lb (87.5 kg)  Height: 5\' 1"  (1.549 m)   Body mass index is 36.47 kg/m.    Assessment and Plan: Marland KitchenMarland KitchenLanah was seen today for sinus problem.  Diagnoses and all orders for this visit:  Acute non-recurrent maxillary sinusitis -     amoxicillin-clavulanate (AUGMENTIN) 875-125 MG tablet; Take 1 tablet by mouth 2 (two) times daily.  Migraine without aura and without status migrainosus, not intractable -     rizatriptan (MAXALT) 10 MG tablet; Take 1 tablet (10 mg total) by mouth as needed for migraine (for migraine, may repeat in 2 hrs PRN, max 10 a month).  Cough -     albuterol (VENTOLIN HFA) 108 (90 Base) MCG/ACT inhaler; Inhale 2 puffs into the lungs every 6 (six) hours as needed.  10 days of symptoms. Sounds likely sinusitis. Treated with augmentin and albuterol for cough. Discussed rest and hydration. Follow up if symptoms worsening. Use flonase for nasal congestion.   maxalt refilled for migraine rescue.     Follow Up Instructions:    I discussed the assessment and treatment plan with the patient. The patient was provided an opportunity to ask questions and all were answered. The patient agreed with the plan and demonstrated  an understanding of the instructions.   The patient was advised to call back or seek an in-person evaluation if the symptoms worsen or if the condition fails to improve as anticipated.  I provided 15 minutes of non-face-to-face time during this encounter.   Iran Planas, PA-C

## 2020-01-01 NOTE — Progress Notes (Signed)
Started 12/22/2019 Started with stuffy nose/sore throat Unable to clear nose Yesterday started moving to chest/developed cough No other symptoms  Taking sinus medicine/dayquil OTC

## 2020-01-09 DIAGNOSIS — Z903 Acquired absence of stomach [part of]: Secondary | ICD-10-CM | POA: Diagnosis not present

## 2020-01-09 DIAGNOSIS — K912 Postsurgical malabsorption, not elsewhere classified: Secondary | ICD-10-CM | POA: Diagnosis not present

## 2020-01-16 ENCOUNTER — Encounter: Payer: Self-pay | Admitting: Medical-Surgical

## 2020-01-16 ENCOUNTER — Ambulatory Visit (INDEPENDENT_AMBULATORY_CARE_PROVIDER_SITE_OTHER): Payer: BC Managed Care – PPO | Admitting: Medical-Surgical

## 2020-01-16 VITALS — BP 116/79 | HR 71 | Temp 98.7°F | Ht 61.0 in | Wt 192.8 lb

## 2020-01-16 DIAGNOSIS — R0609 Other forms of dyspnea: Secondary | ICD-10-CM

## 2020-01-16 DIAGNOSIS — R0600 Dyspnea, unspecified: Secondary | ICD-10-CM

## 2020-01-16 DIAGNOSIS — R1013 Epigastric pain: Secondary | ICD-10-CM

## 2020-01-16 DIAGNOSIS — R110 Nausea: Secondary | ICD-10-CM

## 2020-01-16 NOTE — Progress Notes (Signed)
Subjective:    CC: Epigastric pain, shortness of breath, nausea  HPI: Pleasant 49 year old female presenting today with reports of exercised induced shortness of breath with nausea and epigastric pain.  She is several years status post gastric bypass.  Notes her symptoms started developing approximately 1 to 2 months ago and have become more frequent.  She notes that when she is at the gym exercising, no matter the intensity of her exercise she begins to develop symptoms.  Notes that stretching, bending, and high-impact activities seem to make her symptoms worse.  She is now unable to participate in her activities as usual, instead having to take long breaks due to the significance of the nausea.  At present, she rates her epigastric pain 3/10 but notes that last night it got as high as 8/10.  After her exercise class last night she notes that she did not feel right.  She began to press around on her upper abdomen and found an area just below her sternum that felt very tight and hard as if there was a knot there.  When she pushed on it she got extremely nauseous and felt as if she was going to pass out.  Her symptoms gradually improved and she did not lose consciousness.  She does have a history of gastric reflux but notes her symptoms are nothing like that.  Taking Prilosec daily, tolerating well and feels this works well for her.  She does feel that she has to make extra effort to take a deep breath but does not have any overt dyspnea while at rest.  Notes that she does have intermittent epigastric pain after meals.  Has had no vomiting or diarrhea.  Endorses some constipation, especially over the last few days.  Has seen no hematochezia or melena.  Denies fever, chills, palpitations, diaphoresis, dizziness, worsening headaches, cough, and chest pain radiating to the jaw/neck/arm/back.  Status post cholecystectomy.  I reviewed the past medical history, family history, social history, surgical history, and  allergies today and no changes were needed.  Please see the problem list section below in epic for further details.  Past Medical History: Past Medical History:  Diagnosis Date  . Right carpal tunnel syndrome    EMG scanned in  . Suicide attempt [facility]) 2011   Past Surgical History: Past Surgical History:  Procedure Laterality Date  . CHOLECYSTECTOMY    . NASAL SEPTUM SURGERY     Social History: Social History   Socioeconomic History  . Marital status: Divorced    Spouse name: Not on file  . Number of children: 0  . Years of education: college  . Highest education level: Not on file  Occupational History  . 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   - degree in [place].   Tobacco Use  . Smoking status: Never Smoker  . Smokeless tobacco: Never Used  Substance and Sexual Activity  . Alcohol use: Yes    Alcohol/week: 0.0 standard drinks    Comment: occas  . Drug use: No  . Sexual activity: Not Currently  Other Topics Concern  . 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 Her parents live with her.   Social Determinants of Health   Financial Resource Strain:   . Difficulty of Paying Living Expenses: Not on file  Food Insecurity:   . Worried About Charity fundraiser in  the Last Year: Not on file  . Ran Out of Food in the Last Year: Not on file  Transportation Needs:   . Lack of Transportation (Medical): Not on file  . Lack of Transportation (Non-Medical): Not on file  Physical Activity:   . Days of Exercise per Week: Not on file  . Minutes of Exercise per Session: Not on file  Stress:   . Feeling of Stress : Not on file  Social Connections:   . Frequency of Communication with Friends and Family: Not on file  . Frequency of Social Gatherings with Friends and Family: Not on file  . Attends Religious Services: Not on file  . Active Member of Clubs or Organizations: Not on  file  . Attends Archivist Meetings: Not on file  . Marital Status: Not on file   Family History: Family History  Problem Relation Age of Onset  . Alcoholism Paternal Grandfather   . Hypertension Brother    Allergies: No Known Allergies Medications: See med rec.  Review of Systems: See HPI for pertinent positives and negatives.   Objective:    General: Well Developed, well nourished, and in no acute distress.  Neuro: Alert and oriented x3.  HEENT: Normocephalic, atraumatic.  Skin: Warm and dry. Cardiac: Regular rate and rhythm, no murmurs rubs or gallops, no lower extremity edema.  Respiratory: Clear to auscultation bilaterally. Not using accessory muscles, speaking in full sentences. Abdomen: Soft, nondistended.  Marked tenderness to the epigastric region.  No palpable knot or hardened area at time of exam.  Mild discomfort/pressure to bilateral upper quadrants nontender to bilateral lower quadrants. Bowel sounds + x 4 quadrants. No HSM appreciated.  Impression and Recommendations:    1. Epigastric pain/DOE/nausea With her history of gastric bypass, suspect a hiatal hernia may be her culprit.  Ordering CT abdomen pelvis without contrast for further evaluation.  Advised patient to avoid activities that worsen her pain until we have more information.  Continue Prilosec as prescribed. - CT Abdomen Pelvis Wo Contrast; Future  Return if symptoms worsen or fail to improve. ___________________________________________ Clearnce Sorrel, DNP, APRN, FNP-BC [facility] [place]

## 2020-01-19 ENCOUNTER — Encounter: Payer: Self-pay | Admitting: Medical-Surgical

## 2020-01-23 DIAGNOSIS — K219 Gastro-esophageal reflux disease without esophagitis: Secondary | ICD-10-CM | POA: Diagnosis not present

## 2020-01-23 DIAGNOSIS — K912 Postsurgical malabsorption, not elsewhere classified: Secondary | ICD-10-CM | POA: Diagnosis not present

## 2020-01-23 DIAGNOSIS — Z903 Acquired absence of stomach [part of]: Secondary | ICD-10-CM | POA: Diagnosis not present

## 2020-01-24 ENCOUNTER — Ambulatory Visit (INDEPENDENT_AMBULATORY_CARE_PROVIDER_SITE_OTHER): Payer: BC Managed Care – PPO

## 2020-01-24 ENCOUNTER — Other Ambulatory Visit: Payer: Self-pay

## 2020-01-24 DIAGNOSIS — R1013 Epigastric pain: Secondary | ICD-10-CM | POA: Diagnosis not present

## 2020-01-24 DIAGNOSIS — R109 Unspecified abdominal pain: Secondary | ICD-10-CM | POA: Diagnosis not present

## 2020-01-30 DIAGNOSIS — R1013 Epigastric pain: Secondary | ICD-10-CM | POA: Diagnosis not present

## 2020-01-30 DIAGNOSIS — R1310 Dysphagia, unspecified: Secondary | ICD-10-CM | POA: Diagnosis not present

## 2020-01-30 DIAGNOSIS — Z1211 Encounter for screening for malignant neoplasm of colon: Secondary | ICD-10-CM | POA: Diagnosis not present

## 2020-03-18 DIAGNOSIS — Z6836 Body mass index (BMI) 36.0-36.9, adult: Secondary | ICD-10-CM | POA: Diagnosis not present

## 2020-03-18 DIAGNOSIS — K219 Gastro-esophageal reflux disease without esophagitis: Secondary | ICD-10-CM | POA: Diagnosis not present

## 2020-03-18 DIAGNOSIS — Z903 Acquired absence of stomach [part of]: Secondary | ICD-10-CM | POA: Diagnosis not present

## 2020-03-18 DIAGNOSIS — E669 Obesity, unspecified: Secondary | ICD-10-CM | POA: Diagnosis not present

## 2020-03-28 DIAGNOSIS — K219 Gastro-esophageal reflux disease without esophagitis: Secondary | ICD-10-CM | POA: Diagnosis not present

## 2020-03-28 DIAGNOSIS — K2950 Unspecified chronic gastritis without bleeding: Secondary | ICD-10-CM | POA: Diagnosis not present

## 2020-03-28 DIAGNOSIS — Z1211 Encounter for screening for malignant neoplasm of colon: Secondary | ICD-10-CM | POA: Diagnosis not present

## 2020-03-28 DIAGNOSIS — K449 Diaphragmatic hernia without obstruction or gangrene: Secondary | ICD-10-CM | POA: Diagnosis not present

## 2020-03-28 DIAGNOSIS — K5730 Diverticulosis of large intestine without perforation or abscess without bleeding: Secondary | ICD-10-CM | POA: Diagnosis not present

## 2020-03-28 DIAGNOSIS — K635 Polyp of colon: Secondary | ICD-10-CM | POA: Diagnosis not present

## 2020-03-28 HISTORY — PX: COLONOSCOPY WITH ESOPHAGOGASTRODUODENOSCOPY (EGD): SHX5779

## 2020-03-28 LAB — HM COLONOSCOPY

## 2020-04-03 ENCOUNTER — Encounter: Payer: Self-pay | Admitting: Family Medicine

## 2020-04-05 ENCOUNTER — Other Ambulatory Visit: Payer: Self-pay | Admitting: Physician Assistant

## 2020-04-05 DIAGNOSIS — G43009 Migraine without aura, not intractable, without status migrainosus: Secondary | ICD-10-CM

## 2020-05-16 DIAGNOSIS — E282 Polycystic ovarian syndrome: Secondary | ICD-10-CM | POA: Diagnosis not present

## 2020-05-16 DIAGNOSIS — Z903 Acquired absence of stomach [part of]: Secondary | ICD-10-CM | POA: Diagnosis not present

## 2020-05-16 DIAGNOSIS — E669 Obesity, unspecified: Secondary | ICD-10-CM | POA: Diagnosis not present

## 2020-05-16 DIAGNOSIS — Z6836 Body mass index (BMI) 36.0-36.9, adult: Secondary | ICD-10-CM | POA: Diagnosis not present

## 2020-06-05 ENCOUNTER — Other Ambulatory Visit: Payer: Self-pay | Admitting: Physician Assistant

## 2020-06-05 DIAGNOSIS — G43009 Migraine without aura, not intractable, without status migrainosus: Secondary | ICD-10-CM

## 2020-07-02 ENCOUNTER — Other Ambulatory Visit: Payer: Self-pay | Admitting: Family Medicine

## 2020-07-02 DIAGNOSIS — F418 Other specified anxiety disorders: Secondary | ICD-10-CM

## 2020-09-06 ENCOUNTER — Encounter: Payer: Self-pay | Admitting: Family Medicine

## 2020-09-06 DIAGNOSIS — E282 Polycystic ovarian syndrome: Secondary | ICD-10-CM

## 2020-09-06 DIAGNOSIS — Z903 Acquired absence of stomach [part of]: Secondary | ICD-10-CM

## 2020-09-09 NOTE — Telephone Encounter (Signed)
Have labs drawn from 6/14 and added additional tests today. She needs to make sure they draw both sets of labs.

## 2020-09-11 ENCOUNTER — Other Ambulatory Visit: Payer: Self-pay | Admitting: Family Medicine

## 2020-09-11 DIAGNOSIS — Z1159 Encounter for screening for other viral diseases: Secondary | ICD-10-CM | POA: Diagnosis not present

## 2020-09-11 DIAGNOSIS — Z903 Acquired absence of stomach [part of]: Secondary | ICD-10-CM | POA: Diagnosis not present

## 2020-09-11 DIAGNOSIS — N912 Amenorrhea, unspecified: Secondary | ICD-10-CM | POA: Diagnosis not present

## 2020-09-11 DIAGNOSIS — E539 Vitamin B deficiency, unspecified: Secondary | ICD-10-CM | POA: Diagnosis not present

## 2020-09-11 DIAGNOSIS — E282 Polycystic ovarian syndrome: Secondary | ICD-10-CM | POA: Diagnosis not present

## 2020-09-12 ENCOUNTER — Encounter: Payer: Self-pay | Admitting: Family Medicine

## 2020-09-12 LAB — FOLLICLE STIMULATING HORMONE: FSH: 107.5 m[IU]/mL

## 2020-09-12 LAB — ESTRADIOL: Estradiol: <15 pg/mL

## 2020-09-12 LAB — HEPATITIS C ANTIBODY
Hepatitis C Ab: NONREACTIVE
SIGNAL TO CUT-OFF: 0.01 (ref <1.00)

## 2020-09-12 LAB — TSH: TSH: 2.17 mIU/L

## 2020-09-12 LAB — PROGESTERONE: Progesterone: <0.5 ng/mL

## 2020-09-12 LAB — HEMOGLOBIN A1C
Hgb A1c MFr Bld: 5.3 % of total Hgb (ref <5.7)
Mean Plasma Glucose: 105 mg/dL
eAG (mmol/L): 5.8 mmol/L

## 2020-09-12 LAB — LUTEINIZING HORMONE: LH: 54.8 m[IU]/mL

## 2020-09-16 ENCOUNTER — Encounter: Payer: Self-pay | Admitting: Family Medicine

## 2020-09-18 ENCOUNTER — Encounter: Payer: Self-pay | Admitting: Family Medicine

## 2020-09-20 ENCOUNTER — Encounter: Payer: Self-pay | Admitting: Family Medicine

## 2020-09-20 LAB — B12 AND FOLATE PANEL
Folate: >24 ng/mL
Vitamin B-12: 446 pg/mL (ref 200–1100)

## 2020-09-20 LAB — IRON,TIBC AND FERRITIN PANEL
%SAT: 32 % (calc) (ref 16–45)
Ferritin: 33 ng/mL (ref 16–232)
Iron: 113 ug/dL (ref 40–190)
TIBC: 348 mcg/dL (calc) (ref 250–450)

## 2020-09-20 LAB — VITAMIN B1: Vitamin B1 (Thiamine): 35 nmol/L — ABNORMAL HIGH (ref 8–30)

## 2020-09-20 LAB — VITAMIN B6: Vitamin B6: 40.4 ng/mL — ABNORMAL HIGH (ref 2.1–21.7)

## 2020-09-20 LAB — MAGNESIUM: Magnesium: 2.2 mg/dL (ref 1.5–2.5)

## 2020-10-11 ENCOUNTER — Ambulatory Visit: Payer: BC Managed Care – PPO | Admitting: Family Medicine

## 2020-10-11 ENCOUNTER — Encounter: Payer: Self-pay | Admitting: Family Medicine

## 2020-10-11 ENCOUNTER — Other Ambulatory Visit: Payer: Self-pay

## 2020-10-11 VITALS — BP 127/70 | HR 79 | Ht 61.0 in | Wt 200.3 lb

## 2020-10-11 DIAGNOSIS — F418 Other specified anxiety disorders: Secondary | ICD-10-CM

## 2020-10-11 DIAGNOSIS — K2100 Gastro-esophageal reflux disease with esophagitis, without bleeding: Secondary | ICD-10-CM | POA: Diagnosis not present

## 2020-10-11 DIAGNOSIS — K760 Fatty (change of) liver, not elsewhere classified: Secondary | ICD-10-CM

## 2020-10-11 DIAGNOSIS — Z9989 Dependence on other enabling machines and devices: Secondary | ICD-10-CM

## 2020-10-11 DIAGNOSIS — G43009 Migraine without aura, not intractable, without status migrainosus: Secondary | ICD-10-CM | POA: Diagnosis not present

## 2020-10-11 DIAGNOSIS — G4733 Obstructive sleep apnea (adult) (pediatric): Secondary | ICD-10-CM

## 2020-10-11 NOTE — Assessment & Plan Note (Signed)
Unfortunately she has gained some weight over the last 3 to 4 months because of increased stress levels.  Just encouraged her to get on track with diet and exercise.

## 2020-10-11 NOTE — Assessment & Plan Note (Signed)
Able on current regimen though has had some increased stress with having to return to work.

## 2020-10-11 NOTE — Assessment & Plan Note (Signed)
Stable on current regimen   

## 2020-10-11 NOTE — Progress Notes (Signed)
Established Patient Office Visit  Subjective:  Patient ID: Tonya Phillips, female    DOB: [birth year]  Age: 50 y.o. MRN: [id number]  CC:  Chief Complaint  Patient presents with   work note extension    HPI Tonya Phillips presents for to discuss an extension to be able to work from home she currently works for [employer] [place].  She has been allowed to work from home for the last 2 years and is concerned about going back into the office.  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  She has a history of obstructive sleep apnea and even if she gets mild congestion it is a significant deterrent to her being able to wear her CPAP which then affects her daily quality of life and her ability to focus and stay attentive and on task.  She also has a history of migraines which are oftentimes triggered when she is not feeling well or is sleep deprived.  She also takes care of her elderly parents who live in her home with her and is very fearful of bringing home and illness to them she is requesting paperwork to be completed to see if she would qualify to be able to continue to work from home.  He also has significant GERD symptoms which are currently being evaluated and managed by GI they upped her medication to 40 mg twice a day and seeing her back at the end of September she just had [facility] Harl Bowie so we will call and get a copy of that paperwork.  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   - degree in accounting.   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 Her parents live with her.   Social Determinants of Health   Financial Resource Strain: Not on file  Food Insecurity: Not on file  Transportation Needs: Not on file  Physical Activity: Not on file  Stress: Not on file  Social Connections: Not on file  Intimate Partner Violence: Not on file    Outpatient Medications Prior to Visit  Medication Sig Dispense Refill   albuterol (VENTOLIN HFA) 108 (90 Base) MCG/ACT inhaler Inhale 2 puffs into the lungs every 6 (six) hours as needed. 6.7 g 0   AMBULATORY NON FORMULARY MEDICATION Medication Name: CPAP, humidifier and supplies set to 12 cm  water pressure. . Dx OSA. [employer]. 1 vial 0   omeprazole (PRILOSEC) 20 MG capsule Take 20 mg by mouth daily.      promethazine (PHENERGAN) 25 MG tablet Take 1 tablet (25 mg total) by mouth every 6 (six) hours as needed for nausea or vomiting. 30 tablet 2   rizatriptan (MAXALT) 10 MG tablet Take 1 tablet (10 mg total) by mouth as needed for migraine. APPT FOR REFILLS 10 tablet 0   venlafaxine XR (EFFEXOR-XR) 150 MG 24 hr capsule TAKE 1 CAPSULE BY MOUTH EVERY DAY 90 capsule 2   loratadine (CLARITIN) 10 MG tablet Take 10 mg by mouth daily.     No facility-administered medications prior to visit.    No Known Allergies  ROS Review of Systems    Objective:    Physical Exam Vitals reviewed.  Constitutional:      Appearance: She is well-developed.  HENT:     Head: Normocephalic and atraumatic.  Eyes:      Conjunctiva/sclera: Conjunctivae normal.  Cardiovascular:     Rate and Rhythm: Normal rate.  Pulmonary:     Effort: Pulmonary effort is normal.  Skin:    General: Skin is dry.     Coloration: Skin is not pale.  Neurological:     Mental Status: She is alert and oriented to person, place, and time.  Psychiatric:        Behavior: Behavior normal.    BP 127/70   Pulse 79   Ht '5\' 1"'$  (1.549 m)   Wt 200 lb 4.8 oz (90.9 kg)   SpO2 98% Comment: on RA  BMI 37.85 kg/m  Wt Readings from Last 3 Encounters:  10/11/20 200 lb 4.8 oz (90.9 kg)  01/16/20 192 lb 12.8 oz (87.5 kg)  01/01/20 193 lb (87.5 kg)     Health Maintenance Due  Topic Date Due   COVID-19 Vaccine (1) Never done    There are no preventive care reminders to display for this patient.  Lab Results  Component Value Date   TSH 2.17 09/11/2020   Lab Results  Component Value Date   WBC 9.6 12/31/2017   HGB 14.3 12/31/2017   HCT 42 12/31/2017   MCV 86.5 10/02/2014   PLT 259 10/02/2014   Lab Results  Component Value Date   NA 143 12/31/2017   K 4.8 12/31/2017   CO2 26 04/13/2016   GLUCOSE 85 04/13/2016   BUN 16 12/31/2017   CREATININE 0.7 12/31/2017   BILITOT 0.6 04/13/2016   ALKPHOS 61 12/31/2017   AST 19 12/31/2017   ALT 19 12/31/2017   PROT 7.2 04/13/2016   ALBUMIN 4.2 04/13/2016   CALCIUM 9.3 04/13/2016   Lab Results  Component Value Date   CHOL 223 (A) 12/31/2017   Lab Results  Component Value Date   HDL 42 12/31/2017   Lab Results  Component Value Date   LDLCALC 153 12/31/2017   Lab Results  Component Value Date   TRIG 140 12/31/2017   Lab Results  Component Value Date   CHOLHDL 4.9 04/13/2016   Lab Results  Component Value Date   HGBA1C 5.3 09/11/2020      Assessment & Plan:   Problem List Items Addressed This Visit       Cardiovascular and Mediastinum   Migraine without aura and without status migrainosus, not intractable    Stable on current regimen.         Respiratory   OSA on CPAP    Do feel  like she would benefit from being able to work on home to reduce her risk for infection which can worsen her sleep apnea and make it difficult for her to wear her CPAP.        Digestive   NAFLD (nonalcoholic fatty liver disease)    Unfortunately she has gained some weight over the last 3 to 4 months because of increased stress levels.  Just encouraged her to get on track with diet and exercise.      Gastroesophageal reflux disease    Unfortunately she is really had some difficulty with increase in reflux symptoms of trying Meprazole 40 mg twice daily if that is not effective then they will likely consider switching to a different PPI.        Other   Depression with anxiety - Primary    Able on current regimen though has had some increased stress with having to return to work.        No orders of the defined types were placed in this encounter.  I spent 30 minutes on the day of the encounter to include pre-visit record review, face-to-face time with the patient and post visit ordering of test.   Follow-up: Return if symptoms worsen or fail to improve.    Beatrice Lecher, MD

## 2020-10-11 NOTE — Assessment & Plan Note (Signed)
Unfortunately she is really had some difficulty with increase in reflux symptoms of trying Meprazole 40 mg twice daily if that is not effective then they will likely consider switching to a different PPI.

## 2020-10-11 NOTE — Assessment & Plan Note (Signed)
Do feel like she would benefit from being able to work on home to reduce her risk for infection which can worsen her sleep apnea and make it difficult for her to wear her CPAP.

## 2020-10-13 ENCOUNTER — Other Ambulatory Visit: Payer: Self-pay | Admitting: Family Medicine

## 2020-10-13 DIAGNOSIS — G43009 Migraine without aura, not intractable, without status migrainosus: Secondary | ICD-10-CM

## 2020-10-14 ENCOUNTER — Telehealth: Payer: Self-pay | Admitting: Family Medicine

## 2020-10-14 NOTE — Telephone Encounter (Signed)
Forms placed in Tonya's basket

## 2020-10-15 ENCOUNTER — Encounter: Payer: Self-pay | Admitting: Family Medicine

## 2020-10-15 NOTE — Telephone Encounter (Signed)
Forms faxed.  Charyl Bigger, CMA

## 2020-10-18 ENCOUNTER — Telehealth: Payer: Self-pay | Admitting: *Deleted

## 2020-10-18 NOTE — Telephone Encounter (Signed)
Form completed,faxed,confirmation received and scanned into patient's chart. 

## 2020-11-11 DIAGNOSIS — K219 Gastro-esophageal reflux disease without esophagitis: Secondary | ICD-10-CM | POA: Diagnosis not present

## 2020-11-11 DIAGNOSIS — R1013 Epigastric pain: Secondary | ICD-10-CM | POA: Diagnosis not present

## 2020-12-15 DIAGNOSIS — X58XXXA Exposure to other specified factors, initial encounter: Secondary | ICD-10-CM | POA: Diagnosis not present

## 2020-12-15 DIAGNOSIS — M25562 Pain in left knee: Secondary | ICD-10-CM | POA: Diagnosis not present

## 2020-12-15 DIAGNOSIS — S8392XA Sprain of unspecified site of left knee, initial encounter: Secondary | ICD-10-CM | POA: Diagnosis not present

## 2020-12-16 DIAGNOSIS — M25562 Pain in left knee: Secondary | ICD-10-CM | POA: Diagnosis not present

## 2021-01-16 DIAGNOSIS — N950 Postmenopausal bleeding: Secondary | ICD-10-CM

## 2021-01-16 HISTORY — DX: Postmenopausal bleeding: N95.0

## 2021-01-27 ENCOUNTER — Encounter: Payer: Self-pay | Admitting: Family Medicine

## 2021-01-28 NOTE — Telephone Encounter (Signed)
Patient scheduled.

## 2021-02-06 ENCOUNTER — Other Ambulatory Visit: Payer: Self-pay

## 2021-02-06 ENCOUNTER — Encounter: Payer: Self-pay | Admitting: Family Medicine

## 2021-02-06 ENCOUNTER — Ambulatory Visit: Payer: BC Managed Care – PPO | Admitting: Family Medicine

## 2021-02-06 ENCOUNTER — Other Ambulatory Visit (HOSPITAL_COMMUNITY)
Admission: RE | Admit: 2021-02-06 | Discharge: 2021-02-06 | Disposition: A | Discharge status: Home / Self Care | Payer: BC Managed Care – PPO | Source: Ambulatory Visit | Attending: Family Medicine | Admitting: Family Medicine

## 2021-02-06 VITALS — BP 123/65 | HR 71 | Ht 61.0 in | Wt 206.0 lb

## 2021-02-06 DIAGNOSIS — R300 Dysuria: Secondary | ICD-10-CM | POA: Diagnosis not present

## 2021-02-06 DIAGNOSIS — N950 Postmenopausal bleeding: Secondary | ICD-10-CM | POA: Insufficient documentation

## 2021-02-06 LAB — POCT URINALYSIS DIP (CLINITEK)
Bilirubin, UA: NEGATIVE
Glucose, UA: NEGATIVE mg/dL
Ketones, POC UA: NEGATIVE mg/dL
Leukocytes, UA: NEGATIVE
Nitrite, UA: NEGATIVE
POC PROTEIN,UA: NEGATIVE
Spec Grav, UA: >=1.03 — AB (ref 1.010–1.025)
Urobilinogen, UA: 0.2 E.U./dL
pH, UA: 5.5 (ref 5.0–8.0)

## 2021-02-06 NOTE — Progress Notes (Signed)
Acute Office Visit  Subjective:    Patient ID: Tonya Phillips, female    DOB: Apr 19, 1970, 50 y.o.   MRN: [id number]  No chief complaint on file.   HPI Patient is in today for vaginal bleeding.  Says her last period was in February 2020 and started bleeding again on December 12.  She says it was just 1 day she did have a little bit of pelvic cramping that day and she is had a little bit of cramping on and off very mild since then but no recurrence of bleeding.  No new sexual partners.  She also has noticed a little bit of irritation with urination today.  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   - degree in accounting.   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 Her parents live with her.   Social Determinants of Health   Financial Resource Strain: Not on file  Food Insecurity: Not on file  Transportation Needs: Not on file  Physical Activity: Not on file  Stress: Not on file  Social Connections: Not on file  Intimate Partner Violence: Not on file    Outpatient Medications Prior to Visit   Medication Sig Dispense Refill   RABEprazole (ACIPHEX) 20 MG tablet Take 20 mg by mouth daily.     AMBULATORY NON FORMULARY MEDICATION Medication Name: CPAP, humidifier and supplies set to 12 cm water pressure. . Dx OSA. [employer]. 1 vial 0   promethazine (PHENERGAN) 25 MG tablet Take 1 tablet (25 mg total) by mouth every 6 (six) hours as needed for nausea or vomiting. 30 tablet 2   rizatriptan (MAXALT) 10 MG tablet TAKE 1 TABLET (10 MG TOTAL) BY MOUTH AS NEEDED FOR MIGRAINE. APPT FOR REFILLS 10 tablet 0   venlafaxine XR (EFFEXOR-XR) 150 MG 24 hr capsule TAKE 1 CAPSULE BY MOUTH EVERY DAY 90 capsule 2   albuterol (VENTOLIN HFA) 108 (90 Base) MCG/ACT inhaler Inhale 2 puffs into the lungs every 6 (six) hours as needed. 6.7 g 0   omeprazole (PRILOSEC) 20 MG capsule Take 20 mg by mouth daily.      No facility-administered medications prior to visit.    No Known Allergies  Review of Systems     Objective:    Physical Exam Vitals reviewed. Exam conducted with a chaperone present.  Constitutional:      Appearance: She is well-developed.  HENT:  Head: Normocephalic and atraumatic.  Eyes:     Conjunctiva/sclera: Conjunctivae normal.  Cardiovascular:     Rate and Rhythm: Normal rate.  Pulmonary:     Effort: Pulmonary effort is normal.  Genitourinary:    General: Normal vulva.     Pubic Area: No rash.      Labia:        Right: No lesion.        Left: No lesion.      Urethra: No prolapse or urethral lesion.     Vagina: Normal.     Cervix: Normal.     Uterus: Normal.      Adnexa: Right adnexa normal and left adnexa normal.  Skin:    General: Skin is dry.     Coloration: Skin is not pale.  Neurological:     Mental Status: She is alert and oriented to person, place, and time.  Psychiatric:        Behavior: Behavior normal.    BP 123/65    Pulse 71    Ht 5\' 1"  (1.549 m)    Wt 206 lb (93.4 kg)    SpO2 99%    BMI 38.92 kg/m  Wt Readings from Last 3 Encounters:  02/06/21 206 lb  (93.4 kg)  10/11/20 200 lb 4.8 oz (90.9 kg)  01/16/20 192 lb 12.8 oz (87.5 kg)    There are no preventive care reminders to display for this patient.  There are no preventive care reminders to display for this patient.   Lab Results  Component Value Date   TSH 2.17 09/11/2020   Lab Results  Component Value Date   WBC 9.6 12/31/2017   HGB 14.3 12/31/2017   HCT 42 12/31/2017   MCV 86.5 10/02/2014   PLT 259 10/02/2014   Lab Results  Component Value Date   NA 143 12/31/2017   K 4.8 12/31/2017   CO2 26 04/13/2016   GLUCOSE 85 04/13/2016   BUN 16 12/31/2017   CREATININE 0.7 12/31/2017   BILITOT 0.6 04/13/2016   ALKPHOS 61 12/31/2017   AST 19 12/31/2017   ALT 19 12/31/2017   PROT 7.2 04/13/2016   ALBUMIN 4.2 04/13/2016   CALCIUM 9.3 04/13/2016   Lab Results  Component Value Date   CHOL 223 (A) 12/31/2017   Lab Results  Component Value Date   HDL 42 12/31/2017   Lab Results  Component Value Date   LDLCALC 153 12/31/2017   Lab Results  Component Value Date   TRIG 140 12/31/2017   Lab Results  Component Value Date   CHOLHDL 4.9 04/13/2016   Lab Results  Component Value Date   HGBA1C 5.3 09/11/2020       Assessment & Plan:   Problem List Items Addressed This Visit   None Visit Diagnoses     Dysuria    -  Primary   Relevant Orders   POCT URINALYSIS DIP (CLINITEK) (Completed)   Post-menopausal bleeding       Relevant Orders   US Pelvic Complete With Transvaginal   Cytology - PAP      Postmenopausal bleeding-her last period was almost 3 years ago.  We discussed work-up including doing a pelvic exam which we did today I did not find any specific mass or lesions etc. that should be causing the bleeding.  Had to go ahead and repeat her Pap smear since last 1 was about 3 years ago just to make sure that the cells themselves were normal.  We will  go ahead and get her scheduled for pelvic ultrasound and refer to GYN for endometrial  biopsy.  Dysuria-urinalysis is negative.  No orders of the defined types were placed in this encounter.    Beatrice Lecher, MD

## 2021-02-14 LAB — CYTOLOGY - PAP
Comment: NEGATIVE
Diagnosis: NEGATIVE
Diagnosis: REACTIVE
High risk HPV: NEGATIVE

## 2021-02-17 NOTE — Progress Notes (Signed)
Your Pap smear is normal. You are negative for HPV as well. Repeat pap smear in 5 years.

## 2021-03-01 ENCOUNTER — Encounter: Payer: Self-pay | Admitting: Family Medicine

## 2021-03-04 ENCOUNTER — Other Ambulatory Visit: Payer: Self-pay

## 2021-03-04 DIAGNOSIS — N950 Postmenopausal bleeding: Secondary | ICD-10-CM

## 2021-03-04 DIAGNOSIS — E282 Polycystic ovarian syndrome: Secondary | ICD-10-CM

## 2021-03-05 ENCOUNTER — Other Ambulatory Visit: Payer: Self-pay

## 2021-03-05 ENCOUNTER — Encounter: Payer: Self-pay | Admitting: Family Medicine

## 2021-03-05 ENCOUNTER — Ambulatory Visit (INDEPENDENT_AMBULATORY_CARE_PROVIDER_SITE_OTHER): Payer: BC Managed Care – PPO

## 2021-03-05 DIAGNOSIS — N950 Postmenopausal bleeding: Secondary | ICD-10-CM | POA: Diagnosis not present

## 2021-03-05 DIAGNOSIS — N888 Other specified noninflammatory disorders of cervix uteri: Secondary | ICD-10-CM | POA: Diagnosis not present

## 2021-03-05 DIAGNOSIS — N83201 Unspecified ovarian cyst, right side: Secondary | ICD-10-CM | POA: Diagnosis not present

## 2021-03-06 ENCOUNTER — Encounter: Payer: Self-pay | Admitting: Family Medicine

## 2021-03-06 DIAGNOSIS — N83201 Unspecified ovarian cyst, right side: Secondary | ICD-10-CM

## 2021-03-06 NOTE — Progress Notes (Signed)
Hi Tonya Phillips, the ultrasound shows a cyst approximately 3 cm either right next to or possibly coming from the right ovary.  So in other words it does not look like it is right in in the middle of it.  It almost looks like it is right beside it are slightly attached to it.  Because it is what is called a complicated cyst meaning it has little parts and pockets to it and its 3 cm in size they are recommending further work-up with an MRI.  With and without contrast.  I still want you to see GYN for the bleeding and this is somewhat of a separate issue but I still think we need to move forward with working it up.  So if you are okay with [name] getting that MRI scheduled then please let us know and we will get it done.

## 2021-03-17 ENCOUNTER — Other Ambulatory Visit: Payer: Self-pay

## 2021-03-17 ENCOUNTER — Ambulatory Visit (INDEPENDENT_AMBULATORY_CARE_PROVIDER_SITE_OTHER): Payer: BC Managed Care – PPO

## 2021-03-17 DIAGNOSIS — K5730 Diverticulosis of large intestine without perforation or abscess without bleeding: Secondary | ICD-10-CM | POA: Diagnosis not present

## 2021-03-17 DIAGNOSIS — M47816 Spondylosis without myelopathy or radiculopathy, lumbar region: Secondary | ICD-10-CM | POA: Diagnosis not present

## 2021-03-17 DIAGNOSIS — N83201 Unspecified ovarian cyst, right side: Secondary | ICD-10-CM | POA: Diagnosis not present

## 2021-03-17 DIAGNOSIS — M47817 Spondylosis without myelopathy or radiculopathy, lumbosacral region: Secondary | ICD-10-CM | POA: Diagnosis not present

## 2021-03-17 MED ORDER — GADOBUTROL 1 MMOL/ML IV SOLN
9.0000 mL | Freq: Once | INTRAVENOUS | Status: AC | PRN
  Administered 2021-03-17: 9 mL via INTRAVENOUS

## 2021-03-18 ENCOUNTER — Encounter: Payer: Self-pay | Admitting: Family Medicine

## 2021-03-19 ENCOUNTER — Encounter: Payer: Self-pay | Admitting: Family Medicine

## 2021-03-19 NOTE — Progress Notes (Signed)
Hi Valicia, I am sorry for the delay in getting back to you on your report.  The uterus overall looks good.  That left ovary also looks normal and is measuring appropriately.  The right side contains a cyst which is attached to it.  The cyst is measuring about 2-1/2 cm at the at its largest width.  It looks most consistent with what is called a hemorrhagic cyst meaning that it is filled with blood.  There are a few little calcifications which can indicate that it is more chronic.  They do not see any other worrisome features which is great news!  But I understand if you feel like it is causing some symptoms.  I am happy to get you in with GYN for consultation to talk about maybe having it removed.  Do you have a certain provider or place where you have a preference?  If you do just let me know

## 2021-03-20 ENCOUNTER — Other Ambulatory Visit: Payer: Self-pay

## 2021-03-20 ENCOUNTER — Other Ambulatory Visit (HOSPITAL_COMMUNITY)
Admission: RE | Admit: 2021-03-20 | Discharge: 2021-03-20 | Disposition: A | Discharge status: Home / Self Care | Payer: BC Managed Care – PPO | Source: Ambulatory Visit | Attending: Obstetrics and Gynecology | Admitting: Obstetrics and Gynecology

## 2021-03-20 ENCOUNTER — Encounter: Payer: Self-pay | Admitting: Obstetrics and Gynecology

## 2021-03-20 ENCOUNTER — Ambulatory Visit: Payer: BC Managed Care – PPO | Admitting: Obstetrics and Gynecology

## 2021-03-20 VITALS — BP 125/73 | HR 70 | Resp 16 | Ht 61.0 in | Wt 196.0 lb

## 2021-03-20 DIAGNOSIS — N858 Other specified noninflammatory disorders of uterus: Secondary | ICD-10-CM | POA: Diagnosis not present

## 2021-03-20 DIAGNOSIS — Z1231 Encounter for screening mammogram for malignant neoplasm of breast: Secondary | ICD-10-CM

## 2021-03-20 DIAGNOSIS — N950 Postmenopausal bleeding: Secondary | ICD-10-CM

## 2021-03-20 DIAGNOSIS — N83201 Unspecified ovarian cyst, right side: Secondary | ICD-10-CM | POA: Diagnosis not present

## 2021-03-20 HISTORY — PX: ENDOMETRIAL BIOPSY: SHX622

## 2021-03-20 NOTE — Patient Instructions (Signed)
Your surgery would be a Bilateral salpingo-oophorectomy if you chose that route but this was the only version I could find.

## 2021-03-20 NOTE — Progress Notes (Signed)
GYNECOLOGY OFFICE VISIT NOTE  History:   Tonya Phillips is a 51 y.o. G0 here today for PMB and complex cyst noted on [name] and MRI.   Imaging reports reviewed myself for the ultrasound and the MRI. The MRI shows the cyst is ultimately a chronic hemorrhagic cyst and is < 2cm.    She had years of fertility therapy without success.   She denies any abnormal vaginal discharge, bleeding, pelvic pain or other concerns.     Past Medical History:  Diagnosis Date   Right carpal tunnel syndrome    EMG scanned in   Suicide attempt [facility]) 2011    Past Surgical History:  Procedure Laterality Date   CHOLECYSTECTOMY     LAPAROSCOPIC GASTRIC SLEEVE RESECTION     NASAL SEPTUM SURGERY      The following portions of the patient's history were reviewed and updated as appropriate: allergies, current medications, past family history, past medical history, past social history, past surgical history and problem list.   Health Maintenance:   Diagnosis  Date Value Ref Range Status  02/06/2021   Final   - Negative for Intraepithelial Lesions or Malignancy (NILM)  02/06/2021 - Benign reactive/reparative changes  Final     Normal mammogram on 01/2018.  Normal DEXA 2020  Review of Systems:  Pertinent items noted in HPI and remainder of comprehensive ROS otherwise negative.  Physical Exam:  BP 125/73    Pulse 70    Resp 16    Ht 5\' 1"  (1.549 m)    Wt 196 lb (88.9 kg)    LMP 10/20/2018    BMI 37.03 kg/m  CONSTITUTIONAL: Well-developed, well-nourished female in no acute distress.  HEENT:  Normocephalic, atraumatic. External right and left ear normal. No scleral icterus.  NECK: Normal range of motion, supple, no masses noted on observation SKIN: No rash noted. Not diaphoretic. No erythema. No pallor. MUSCULOSKELETAL: Normal range of motion. No edema noted. NEUROLOGIC: Alert and oriented to person, place, and time. Normal muscle tone coordination. No cranial nerve deficit noted. PSYCHIATRIC: Normal  mood and affect. Normal behavior. Normal judgment and thought content.  CARDIOVASCULAR: Normal heart rate noted RESPIRATORY: Effort and breath sounds normal, no problems with respiration noted ABDOMEN: No masses noted. No other overt distention noted.    PELVIC: Normal appearing external genitalia; normal urethral meatus; normal appearing vaginal mucosa and cervix.  No abnormal discharge noted.  Normal uterine size, no other palpable masses, no uterine or adnexal tenderness. Performed in the presence of a chaperone  Labs and Imaging No results found for this or any previous visit (from the past 168 hour(s)). MR Pelvis W Wo Contrast  Result Date: 03/18/2021 CLINICAL DATA:  Right ovarian mass EXAM: MRI PELVIS WITHOUT AND WITH CONTRAST TECHNIQUE: Multiplanar multisequence MR imaging of the pelvis was performed both before and after administration of intravenous contrast. CONTRAST:  52mL GADAVIST GADOBUTROL 1 MMOL/ML IV SOLN COMPARISON:  Pelvic ultrasound 03/05/2021, CT abdomen and pelvis 01/24/2020 FINDINGS: Study is limited due to motion. Urinary Tract: Urinary bladder appears within normal limits. Urethra appears normal. Bowel:  Colonic diverticulosis.  No evidence of bowel obstruction. Vascular/Lymphatic: No pathologically enlarged lymph nodes. No significant vascular abnormality seen. Reproductive: Uterus measures 2.8 x 3.9 x 5.3 cm. No suspicious myometrial mass identified. Endometrium is normal thickness with no suspicious lesions. Cervix appears within normal limits. Vaginal canal is unremarkable. Left ovary measures 1.9 x 1.6 x 1.6 cm and appears normal. Right ovary measures 1.5 x 1.6 x 1.9  cm and contains a posterior exophytic complex cyst measuring 1.7 x 1.6 x 2.5 cm. The cyst demonstrates hyperintense T1 and T2 signal with hypointense T2 signal hemosiderin rim, consistent with hemorrhagic cyst. There are also a few punctate peripheral calcifications which were also visualized on previous CT and  ultrasound. No suspicious nodular enhancement identified within the cyst. Other:  No ascites. Musculoskeletal: Degenerative changes in the visualized lumbar spine especially at L5-S1. No suspicious bony lesions identified. IMPRESSION: 1. Complex right ovarian cyst consistent with a chronic hemorrhagic cyst with peripheral calcifications. 2. Colonic diverticulosis. Electronically Signed   By: Ofilia Neas M.D.   On: 03/18/2021 16:25   US Pelvic Complete With Transvaginal  Result Date: 03/05/2021 CLINICAL DATA:  Postmenopausal bleeding EXAM: TRANSABDOMINAL AND TRANSVAGINAL ULTRASOUND OF PELVIS TECHNIQUE: Both transabdominal and transvaginal ultrasound examinations of the pelvis were performed. Transabdominal technique was performed for global imaging of the pelvis including uterus, ovaries, adnexal regions, and pelvic cul-de-sac. It was necessary to proceed with endovaginal exam following the transabdominal exam to visualize the endometrium and adnexa. COMPARISON:  None Correlation: CT abdomen and pelvis 01/24/2020 FINDINGS: Uterus Measurements: 7.2 x 3.0 x 3.8 cm = volume: 42 mL. Anteverted. Normal morphology without mass. Small nabothian cyst at cervix. Endometrium Thickness: 4 mm.  No endometrial fluid or mass Right ovary Measurements: 2.8 x 1.6 x 2.7 cm = volume: 6.3 mL. Dominant follicle RIGHT ovary 2.1 cm greatest diameter. Adjacent mass 3.0 x 1.4 x 2.0 cm which contains multiple small dependent shadowing echogenic foci/calcifications. This corresponds to and intermediate attenuation mass 2.9 x 2.5 x 1.9 cm adjacent to or arising from the RIGHT ovary on the prior exam, also containing calcifications. No definite fat within this lesion on the prior CT. 8 mm mural nodule within lesion. Left ovary Measurements: 2.5 x 1.0 x 1.7 cm = volume: 2.2 mL. Normal morphology without mass Other findings No free pelvic fluid or additional masses. IMPRESSION: Complicated 3.0 cm diameter cystic lesion either adjacent to  or arising from the RIGHT ovary, favor RIGHT ovarian origin, containing calcifications as well as an 8 mm mural nodule. While no definite macroscopic foci of fat are seen within this lesion on CT, this could potentially represent a dermoid tumor, complicated cyst, old hemorrhagic cyst, or cystic neoplasm. As this lesion is indeterminate, further characterization of this lesion by MR imaging with and without contrast recommended. Electronically Signed   By: Lavonia Dana M.D.   On: 03/05/2021 16:42       GYNECOLOGY OFFICE PROCEDURE NOTE   Tonya Phillips is a 51 y.o. G0 here for endometrial biopsy for PMB.    ENDOMETRIAL BIOPSY     The indications for endometrial biopsy were reviewed.   Risks of the biopsy including cramping, bleeding, infection, uterine perforation, inadequate specimen and need for additional procedures were discussed. Offered alternative of hysteroscopy, dilation and curettage in OR. The patient states she understands the R/B/I/A and agrees to undergo procedure today. Urine pregnancy test was Not indicated. Consent was signed. Time out was performed.    Patient was positioned in dorsal lithotomy position. A vaginal speculum was placed.  The cervix was visualized and was prepped with Betadine.  A single-toothed tenaculum was placed on the anterior lip of the cervix to stabilize it. The 3 mm pipelle was easily introduced into the endometrial cavity without difficulty to a depth of 7 cm, and a Scant amount of tissue was obtained after two passes and sent to pathology. The instruments were removed  from the patient's vagina. Minimal bleeding from the cervix was noted. The patient tolerated the procedure well.   Patient was given post procedure instructions.  Will follow up pathology and manage accordingly; patient will be contacted with results and recommendations.  Routine preventative health maintenance measures emphasized.  Assessment and Plan:  Tonya Phillips was seen today for pmb.  Diagnoses  and all orders for this visit:  Postmenopausal bleeding -     Surgical pathology( [facility] [place])  Encounter for screening mammogram for malignant neoplasm of breast -     MM 3D SCREEN BREAST BILATERAL; Future  Cyst of right ovary - Could monitor the cyst by [name] in the future since well established by MRI what the cyst is.  - She would prefer not to do monitoring and would prefer removal of her tubes and ovaries if not her uterus and cervix as well given PMB and her risks factors her endometrial hyperplasia/ca.  - We discussed that if EMB is negative, she would not need to have a hyst but could still proceed due to those risk factors but it is a higher risk surgery than BSO alone. TLH would have much higher risk of injury to bladder and ureters as well as having a longer recovery with greater restrictions.  - She will review the information given on both surgeries and she will come back for preop visit.    Routine preventative health maintenance measures emphasized. Please refer to After Visit Summary for other counseling recommendations.   Return in about 2 weeks (around 04/03/2021) for pre-op visit.  Radene Gunning, MD, [place] [facility], [place]

## 2021-03-21 ENCOUNTER — Encounter: Payer: Self-pay | Admitting: Obstetrics and Gynecology

## 2021-03-24 LAB — SURGICAL PATHOLOGY

## 2021-04-09 ENCOUNTER — Other Ambulatory Visit: Payer: Self-pay | Admitting: Family Medicine

## 2021-04-09 DIAGNOSIS — F418 Other specified anxiety disorders: Secondary | ICD-10-CM

## 2021-04-10 ENCOUNTER — Encounter: Payer: BC Managed Care – PPO | Admitting: Obstetrics and Gynecology

## 2021-04-10 ENCOUNTER — Ambulatory Visit: Payer: BC Managed Care – PPO | Admitting: Obstetrics and Gynecology

## 2021-04-10 ENCOUNTER — Encounter: Payer: Self-pay | Admitting: Family Medicine

## 2021-05-02 ENCOUNTER — Encounter: Payer: Self-pay | Admitting: Obstetrics and Gynecology

## 2021-05-02 ENCOUNTER — Other Ambulatory Visit: Payer: Self-pay | Admitting: Family Medicine

## 2021-05-02 DIAGNOSIS — F418 Other specified anxiety disorders: Secondary | ICD-10-CM

## 2021-05-05 ENCOUNTER — Encounter: Payer: Self-pay | Admitting: Obstetrics and Gynecology

## 2021-05-05 ENCOUNTER — Other Ambulatory Visit: Payer: Self-pay

## 2021-05-05 ENCOUNTER — Encounter: Payer: Self-pay | Admitting: Family Medicine

## 2021-05-05 DIAGNOSIS — F418 Other specified anxiety disorders: Secondary | ICD-10-CM

## 2021-05-05 MED ORDER — VENLAFAXINE HCL ER 150 MG PO CP24: 150.0000 mg | ORAL_CAPSULE | Freq: Every day | ORAL | 0 refills | Status: DC

## 2021-05-08 ENCOUNTER — Encounter (INDEPENDENT_AMBULATORY_CARE_PROVIDER_SITE_OTHER): Payer: Self-pay | Admitting: *Deleted

## 2021-05-08 NOTE — Progress Notes (Signed)
FMLA forms filled out and original mailed to pt's home address.  Forms scanned into chart. ?

## 2021-05-09 DIAGNOSIS — N83209 Unspecified ovarian cyst, unspecified side: Secondary | ICD-10-CM | POA: Insufficient documentation

## 2021-05-09 DIAGNOSIS — N950 Postmenopausal bleeding: Secondary | ICD-10-CM | POA: Insufficient documentation

## 2021-05-09 NOTE — H&P (Signed)
Faculty Practice Obstetrics and Gynecology Attending History and Physical ? ?Tonya Phillips is a 51 y.o. G0P0000 who presents for hysterectomy and BSO vs RSO and cystoscopy due to right ovarian cyst and postmenopausal bleeding with risk factors for endometrial cancer. Her EMB was negative with mixed hormone effect. Her pap was normal and HPV negative. She had an [facility] in July 2022 which was 107.5 confirming menopause in the s/o of her amenorrhea.  ? ?She has a history of infertility - she tried years of therapy without success.  ? ?Denies any abnormal vaginal discharge, fevers, chills, sweats, dysuria, nausea, vomiting, other GI or GU symptoms or other general symptoms. ? ?Past Medical History:  ?Diagnosis Date  ? Right carpal tunnel syndrome   ? EMG scanned in  ? Suicide attempt [facility]) 2011  ? ?Past Surgical History:  ?Procedure Laterality Date  ? CHOLECYSTECTOMY    ? LAPAROSCOPIC GASTRIC SLEEVE RESECTION    ? NASAL SEPTUM SURGERY    ? ?OB History  ?Gravida Para Term Preterm AB Living  ?0 0 0 0 0 0  ?SAB IAB Ectopic Multiple Live Births  ?0 0 0 0 0  ?Patient denies any other pertinent gynecologic issues.  ?No current facility-administered medications on file prior to encounter.  ? ?Current Outpatient Medications on File Prior to Encounter  ?Medication Sig Dispense Refill  ? AMBULATORY NON FORMULARY MEDICATION Medication Name: CPAP, humidifier and supplies set to 12 cm water pressure. . Dx OSA. [employer]. 1 vial 0  ? meloxicam (MOBIC) 15 MG tablet Take 15 mg by mouth daily.    ? promethazine (PHENERGAN) 25 MG tablet Take 1 tablet (25 mg total) by mouth every 6 (six) hours as needed for nausea or vomiting. 30 tablet 2  ? RABEprazole (ACIPHEX) 20 MG tablet Take 20 mg by mouth daily.    ? rizatriptan (MAXALT) 10 MG tablet TAKE 1 TABLET (10 MG TOTAL) BY MOUTH AS NEEDED FOR MIGRAINE. APPT FOR REFILLS 10 tablet 0  ? ?No Known Allergies ? ?Social History:   reports that she has never smoked. She has never used smokeless tobacco.  She reports current alcohol use. She reports that she does not use drugs. ?Family History  ?Problem Relation Age of Onset  ? Alcoholism Paternal Grandfather   ? Hypertension Brother   ? ? ?Review of Systems: Pertinent items noted in HPI and remainder of comprehensive ROS otherwise negative. ? ?PHYSICAL EXAM: ?Last menstrual period 10/20/2018. ?CONSTITUTIONAL: Well-developed, well-nourished female in no acute distress.  ?HENT:  Normocephalic, atraumatic, External right and left ear normal. Oropharynx is clear and moist ?EYES: Conjunctivae and EOM are normal. Pupils are equal, round, and reactive to light. No scleral icterus.  ?NECK: Normal range of motion, supple, no masses ?SKIN: Skin is warm and dry. No rash noted. Not diaphoretic. No erythema. No pallor. ?NEUROLOGIC: Alert and oriented to person, place, and time. Normal reflexes, muscle tone coordination. No cranial nerve deficit noted. ?PSYCHIATRIC: Normal mood and affect. Normal behavior. Normal judgment and thought content. ?CARDIOVASCULAR: Normal heart rate noted, regular rhythm ?RESPIRATORY: Effort and breath sounds normal, no problems with respiration noted ?ABDOMEN: Soft, nontender, nondistended. ?PELVIC: Deferred. Previous normal at prior appointment.  ?MUSCULOSKELETAL: Normal range of motion. No tenderness.  No cyanosis, clubbing, or edema.  2+ distal pulses. ? ?Labs: ?No results found for this or any previous visit (from the past 336 hour(s)). ? ?Imaging Studies: ?No results found. ? ?Assessment: ?Active Problems: ?  Ovarian cyst ?  Postmenopausal bleeding ? ? ?Plan: ?-  Diagnosis: postmenopausal bleeding with risk factors for hyperplasia/malignancy (BMI, G0, PCOS), right complex ovarian cyst ?- Planned surgery: TLH, RSO, left salpingectomy, cytoscopy ?- Risks of surgery include but are not limited to: bleeding, infection, injury to surrounding organs/tissues (i.e. bowel/bladder/ureters), need for additional procedures, wound complications, hospital  re-admission, and conversion to open surgery, additional complications:  symptoms of surgical menopause ?- We discussed postop restrictions, precautions and expectations ?- Preop testing needed:  per anesthesia guidelines ?- We discussed alternatives to surgery: Could monitor the cyst by [name] in the future since well established by MRI what the cyst is.  She would prefer not to do monitoring and would prefer removal of her tubes and ovaries if not her uterus and cervix as well given PMB and her risks factors her endometrial hyperplasia/ca. Reviewed for cyst alone, she would not need to have a hysterectomy but could still proceed due to those risk factors but it is a higher risk surgery than BSO alone. TLH would have much higher risk of injury to bladder and ureters as well as having a longer recovery with greater restrictions.  ? ?Radene Gunning, MD, FACOG ?Obstetrician Social research officer, government, Faculty Practice ?[facility] [place] ? ?

## 2021-05-12 ENCOUNTER — Encounter (HOSPITAL_BASED_OUTPATIENT_CLINIC_OR_DEPARTMENT_OTHER): Payer: Self-pay | Admitting: Obstetrics and Gynecology

## 2021-05-12 ENCOUNTER — Other Ambulatory Visit: Payer: Self-pay

## 2021-05-12 DIAGNOSIS — N950 Postmenopausal bleeding: Secondary | ICD-10-CM | POA: Diagnosis not present

## 2021-05-12 DIAGNOSIS — E282 Polycystic ovarian syndrome: Secondary | ICD-10-CM | POA: Diagnosis not present

## 2021-05-12 DIAGNOSIS — Z01818 Encounter for other preprocedural examination: Secondary | ICD-10-CM | POA: Diagnosis not present

## 2021-05-12 DIAGNOSIS — E669 Obesity, unspecified: Secondary | ICD-10-CM | POA: Diagnosis not present

## 2021-05-12 NOTE — Progress Notes (Signed)
?PLEASE WEAR A MASK OUT IN PUBLIC AND SOCIAL DISTANCE AND [place] YOUR HANDS FREQUENTLY. PLEASE ASK ALL YOUR CLOSE HOUSEHOLD CONTACT TO WEAR MASK OUT IN PUBLIC AND SOCIAL DISTANCE AND [place] HANDS FREQUENTLY ALSO. ? ? ? ? ? Your procedure is scheduled on Wednesday, 05/21/21. ? Report to Center ? Call this number if you have problems the morning of surgery  [phone]. ? ? [place].  WE ARE LOCATED IN [facility]. ? ?PLEASE BRING YOUR INSURANCE CARD AND PHOTO ID DAY OF SURGERY. ? ?ONLY 2 PEOPLE ARE ALLOWED IN  WAITING  ROOM.  ?                                   ? REMEMBER: ? DO NOT EAT FOOD, CANDY GUM OR MINTS  AFTER MIDNIGHT THE NIGHT BEFORE YOUR SURGERY . YOU MAY HAVE CLEAR LIQUIDS FROM MIDNIGHT THE NIGHT BEFORE YOUR SURGERY UNTIL  10:00 am. NO CLEAR LIQUIDS AFTER  10:00 am DAY OF SURGERY. ? ?YOU MAY  BRUSH YOUR TEETH MORNING OF SURGERY AND RINSE YOUR MOUTH OUT, NO CHEWING GUM CANDY OR MINTS. ? ? ? ? ?CLEAR LIQUID DIET ? ? ?Foods Allowed                                                                     Foods Excluded ? ?Coffee and tea, regular and decaf                             liquids that you cannot  ?Plain Jell-O any favor except red or purple                                           see through such as: ?Fruit ices (not with fruit pulp)                                     milk, soups, orange juice  ?Iced Popsicles                                    All solid food ?Carbonated beverages, regular and diet                                    ?Cranberry, grape and apple juices ?Sports drinks like Gatorade ? ?Sample Menu ?Breakfast                                Lunch  Supper ?Cranberry juice                                           ?Jell-O                                     Grape juice                           Apple juice ?Coffee or tea                        Jell-O                                       Popsicle ?                                               Coffee or tea                        Coffee or tea ? ?_____________________________________________________________________ ?  ? ? TAKE THESE MEDICATIONS MORNING OF SURGERY WITH A SIP OF WATER: Aciphex, Effexor, Flonase if needed, Phenergan if needed, Maxalt if needed ? ?TWO VISITORS ARE  ALLOWED IN WAITING ROOM ONLY DAY OF SURGERY.   A MASK MUST BE WORN IN THE WAITING ROOM.  ? ? UP TO 4 VISITORS  MAY VISIT IN THE EXTENDED RECOVERY ROOM UNTIL 800 PM ONLY.  1 VISITOR AGE 49 AND OVER MAY SPEND THE NIGHT AND MUST BE IN EXTENDED RECOVERY ROOM NO LATER THAN 800 PM . YOUR DISCHARGE TIME AFTER YOU SPEND THE NIGHT IS 900 AM THE MORNING AFTER YOUR SURGERY. ? ?YOU MAY PACK A SMALL OVERNIGHT BAG WITH TOILETRIES FOR YOUR OVERNIGHT STAY IF YOU WISH. ? ?YOUR PRESCRIPTION MEDICATIONS WILL BE PROVIDED DURING [place]. ? ?PLEASE BRING YOUR CPAP WITH YOU. ? ? ?ALL PERSONS VISITING IN EXTENDED RECOVERY ROOM MUST WEAR A MASK. ?                                   ?DO NOT WEAR JEWERLY, MAKE UP. ?DO NOT WEAR LOTIONS, POWDERS, PERFUMES OR NAIL POLISH ON YOUR FINGERNAILS. TOENAIL POLISH IS OK TO WEAR. ?DO NOT SHAVE FOR 48 HOURS PRIOR TO DAY OF SURGERY. ?MEN MAY SHAVE FACE AND NECK. ?CONTACTS, GLASSES, OR DENTURES MAY NOT BE WORN TO SURGERY. ? ?REMEMBER: NO SMOKING, DRUGS OR ALCOHOL FOR 24 HOURS BEFORE YOUR SURGERY. ?                                   ?[place] IS NOT RESPONSIBLE  FOR ANY BELONGINGS.                                  ?                                  . ?  [place] - Preparing for Surgery ?Before surgery, you can play an important role.  Because skin is not sterile, your skin needs to be as free of germs as possible.  You can reduce the number of germs on your skin by washing with CHG (chlorahexidine gluconate) soap before surgery.  CHG is an antiseptic cleaner which kills germs and bonds with the skin to continue killing germs even after  washing. ?Please DO NOT use if you have an allergy to CHG or antibacterial soaps.  If your skin becomes reddened/irritated stop using the CHG and inform your nurse when you arrive at Short Stay. ?Do not shave (including legs and underarms) for at least 48 hours prior to the first CHG shower.  You may shave your face/neck. ?Please follow these instructions carefully: ? 1.  Shower with CHG Soap the night before surgery and the  morning of Surgery. ? 2.  If you choose to wash your hair, wash your hair first as usual with your  normal  shampoo. ? 3.  After you shampoo, rinse your hair and body thoroughly to remove the  shampoo.                            ?4.  Use CHG as you would any other liquid soap.  You can apply chg directly  to the skin and wash  ?                    Gently with a scrungie or clean washcloth. ? 5.  Apply the CHG Soap to your body ONLY FROM THE NECK DOWN.   Do not use on face/ open      ?                     Wound or open sores. Avoid contact with eyes, ears mouth and genitals (private parts).  ?                     Production manager,  Genitals (private parts) with your normal soap. ?            6.  Wash thoroughly, paying special attention to the area where your surgery  will be performed. ? 7.  Thoroughly rinse your body with warm water from the neck down. ? 8.  DO NOT shower/wash with your normal soap after using and rinsing off  the CHG Soap. ?               9.  Pat yourself dry with a clean towel. ?           10.  Wear clean pajamas. ?           11.  Place clean sheets on your bed the night of your first shower and do not  sleep with pets. ?Day of Surgery : ?Do not apply any lotions/deodorants the morning of surgery.  Please wear clean clothes to the hospital/surgery center. ? ?IF YOU HAVE ANY SKIN IRRITATION OR PROBLEMS WITH THE SURGICAL SOAP, PLEASE GET A BAR OF GOLD DIAL SOAP AND SHOWER THE NIGHT BEFORE YOUR SURGERY AND THE MORNING OF YOUR SURGERY. PLEASE LET THE NURSE KNOW MORNING OF YOUR SURGERY  IF YOU HAD ANY PROBLEMS WITH THE SURGICAL SOAP. ? ? ?________________________________________________________________________                  ?                                    ?  QUESTIONS CALL Adaora Mchaney PRE OP NURSE PHONE [phone].                                    ?

## 2021-05-12 NOTE — Progress Notes (Signed)
Spoke w/ via phone for pre-op interview---Tonya Phillips ?Lab needs dos---- none              ?Lab results------05/19/21 lab appt for CBC, type & screen ?COVID test -----patient states asymptomatic no test needed ?Arrive at -------1100 on Wednesday, 05/21/2021 ?NPO after MN NO Solid Food.  Clear liquids from MN until---1000 ?Med rec completed ?Medications to take morning of surgery -----Aciphex, Effexor, Flonase prn, Phenergan prn, Maxalt prn ?Diabetic medication -----n/a ?Patient instructed no nail polish to be worn day of surgery ?Patient instructed to bring photo id and insurance card day of surgery ?Patient aware to have Driver (ride ) / caregiver    for 24 hours after surgery - Tonya Phillips, Tonya Phillips ?Patient Special Instructions -----Bring CPAP. Extended recovery instructions given. ?Pre-Op special Istructions -----none ?Patient verbalized understanding of instructions that were given at this phone interview. ?Patient denies shortness of breath, chest pain, fever, cough at this phone interview.  ?

## 2021-05-16 MED ORDER — VENLAFAXINE HCL ER 150 MG PO CP24: 150.0000 mg | ORAL_CAPSULE | Freq: Every day | ORAL | 0 refills | Status: DC

## 2021-05-19 ENCOUNTER — Encounter (HOSPITAL_COMMUNITY)
Admission: RE | Admit: 2021-05-19 | Discharge: 2021-05-19 | Disposition: A | Discharge status: Home / Self Care | Payer: BC Managed Care – PPO | Source: Ambulatory Visit | Attending: Obstetrics and Gynecology | Admitting: Obstetrics and Gynecology

## 2021-05-19 DIAGNOSIS — Z01818 Encounter for other preprocedural examination: Secondary | ICD-10-CM | POA: Insufficient documentation

## 2021-05-19 DIAGNOSIS — D270 Benign neoplasm of right ovary: Secondary | ICD-10-CM | POA: Diagnosis not present

## 2021-05-19 DIAGNOSIS — N72 Inflammatory disease of cervix uteri: Secondary | ICD-10-CM | POA: Diagnosis not present

## 2021-05-19 DIAGNOSIS — D251 Intramural leiomyoma of uterus: Secondary | ICD-10-CM | POA: Diagnosis not present

## 2021-05-19 DIAGNOSIS — N950 Postmenopausal bleeding: Secondary | ICD-10-CM | POA: Insufficient documentation

## 2021-05-19 DIAGNOSIS — E669 Obesity, unspecified: Secondary | ICD-10-CM | POA: Diagnosis not present

## 2021-05-19 DIAGNOSIS — E282 Polycystic ovarian syndrome: Secondary | ICD-10-CM | POA: Diagnosis not present

## 2021-05-19 DIAGNOSIS — N83201 Unspecified ovarian cyst, right side: Secondary | ICD-10-CM | POA: Diagnosis not present

## 2021-05-19 LAB — CBC
HCT: 41.7 % (ref 36.0–46.0)
Hemoglobin: 14.2 g/dL (ref 12.0–15.0)
MCH: 30.7 pg (ref 26.0–34.0)
MCHC: 34.1 g/dL (ref 30.0–36.0)
MCV: 90.3 fL (ref 80.0–100.0)
Platelets: 218 10*3/uL (ref 150–400)
RBC: 4.62 MIL/uL (ref 3.87–5.11)
RDW: 13.2 % (ref 11.5–15.5)
WBC: 8.6 10*3/uL (ref 4.0–10.5)
nRBC: 0 % (ref 0.0–0.2)

## 2021-05-21 ENCOUNTER — Observation Stay (HOSPITAL_BASED_OUTPATIENT_CLINIC_OR_DEPARTMENT_OTHER)
Admission: RE | Admit: 2021-05-21 | Discharge: 2021-05-22 | Disposition: A | Discharge status: Home / Self Care | Payer: BC Managed Care – PPO | Source: Ambulatory Visit | Attending: Obstetrics and Gynecology | Admitting: Obstetrics and Gynecology

## 2021-05-21 ENCOUNTER — Ambulatory Visit (HOSPITAL_BASED_OUTPATIENT_CLINIC_OR_DEPARTMENT_OTHER): Payer: BC Managed Care – PPO | Admitting: Anesthesiology

## 2021-05-21 ENCOUNTER — Encounter (HOSPITAL_BASED_OUTPATIENT_CLINIC_OR_DEPARTMENT_OTHER): Payer: Self-pay | Admitting: Obstetrics and Gynecology

## 2021-05-21 ENCOUNTER — Encounter (HOSPITAL_BASED_OUTPATIENT_CLINIC_OR_DEPARTMENT_OTHER)
Admission: RE | Disposition: A | Discharge status: Home / Self Care | Payer: Self-pay | Source: Ambulatory Visit | Attending: Obstetrics and Gynecology

## 2021-05-21 ENCOUNTER — Other Ambulatory Visit: Payer: Self-pay

## 2021-05-21 DIAGNOSIS — N83209 Unspecified ovarian cyst, unspecified side: Secondary | ICD-10-CM | POA: Insufficient documentation

## 2021-05-21 DIAGNOSIS — N72 Inflammatory disease of cervix uteri: Secondary | ICD-10-CM | POA: Diagnosis not present

## 2021-05-21 DIAGNOSIS — Z90710 Acquired absence of both cervix and uterus: Secondary | ICD-10-CM

## 2021-05-21 DIAGNOSIS — N950 Postmenopausal bleeding: Secondary | ICD-10-CM | POA: Diagnosis not present

## 2021-05-21 DIAGNOSIS — D270 Benign neoplasm of right ovary: Principal | ICD-10-CM | POA: Insufficient documentation

## 2021-05-21 DIAGNOSIS — N83201 Unspecified ovarian cyst, right side: Secondary | ICD-10-CM

## 2021-05-21 DIAGNOSIS — N879 Dysplasia of cervix uteri, unspecified: Secondary | ICD-10-CM | POA: Diagnosis not present

## 2021-05-21 DIAGNOSIS — D251 Intramural leiomyoma of uterus: Secondary | ICD-10-CM | POA: Insufficient documentation

## 2021-05-21 DIAGNOSIS — E669 Obesity, unspecified: Secondary | ICD-10-CM

## 2021-05-21 DIAGNOSIS — E282 Polycystic ovarian syndrome: Secondary | ICD-10-CM

## 2021-05-21 HISTORY — DX: Migraine, unspecified, not intractable, without status migrainosus: G43.909

## 2021-05-21 HISTORY — DX: Personal history of other endocrine, nutritional and metabolic disease: Z86.39

## 2021-05-21 HISTORY — DX: Polycystic ovarian syndrome: E28.2

## 2021-05-21 HISTORY — DX: Anxiety disorder, unspecified: F41.9

## 2021-05-21 HISTORY — DX: Unspecified osteoarthritis, unspecified site: M19.90

## 2021-05-21 HISTORY — DX: Depression, unspecified: F32.A

## 2021-05-21 HISTORY — PX: CYSTOSCOPY: SHX5120

## 2021-05-21 HISTORY — DX: Gastro-esophageal reflux disease without esophagitis: K21.9

## 2021-05-21 HISTORY — DX: Presence of spectacles and contact lenses: Z97.3

## 2021-05-21 HISTORY — DX: Sleep apnea, unspecified: G47.30

## 2021-05-21 HISTORY — DX: Personal history of other diseases of the digestive system: Z87.19

## 2021-05-21 HISTORY — PX: TOTAL LAPAROSCOPIC HYSTERECTOMY WITH SALPINGECTOMY: SHX6742

## 2021-05-21 LAB — ABO/RH: ABO/RH(D): A POS

## 2021-05-21 LAB — TYPE AND SCREEN
ABO/RH(D): A POS
Antibody Screen: NEGATIVE

## 2021-05-21 SURGERY — HYSTERECTOMY, TOTAL, LAPAROSCOPIC, WITH SALPINGECTOMY
Anesthesia: General | Site: Urethra

## 2021-05-21 MED ORDER — SUGAMMADEX SODIUM 200 MG/2ML IV SOLN
INTRAVENOUS | Status: DC | PRN
  Administered 2021-05-21: 200 mg via INTRAVENOUS

## 2021-05-21 MED ORDER — ACETAMINOPHEN 500 MG PO TABS
1000.0000 mg | ORAL_TABLET | Freq: Once | ORAL | Status: AC
Start: 2021-05-21 — End: 2021-05-21
  Administered 2021-05-21: 1000 mg via ORAL

## 2021-05-21 MED ORDER — OXYCODONE-ACETAMINOPHEN 5-325 MG PO TABS
2.0000 | ORAL_TABLET | ORAL | Status: DC | PRN
  Administered 2021-05-21: 1 via ORAL
  Administered 2021-05-21 – 2021-05-22 (×3): 2 via ORAL

## 2021-05-21 MED ORDER — SOD CITRATE-CITRIC ACID 500-334 MG/5ML PO SOLN: 30.0000 mL | ORAL | Status: AC

## 2021-05-21 MED ORDER — OXYCODONE HCL 5 MG/5ML PO SOLN: 5.0000 mg | Freq: Once | ORAL | Status: DC | PRN

## 2021-05-21 MED ORDER — CEFAZOLIN SODIUM-DEXTROSE 2-4 GM/100ML-% IV SOLN
2.0000 g | INTRAVENOUS | Status: AC
  Administered 2021-05-21: 2 g via INTRAVENOUS

## 2021-05-21 MED ORDER — HYDROMORPHONE HCL 1 MG/ML IJ SOLN: 0.2000 mg | INTRAMUSCULAR | Status: DC | PRN

## 2021-05-21 MED ORDER — GABAPENTIN 100 MG PO CAPS
ORAL_CAPSULE | ORAL | Status: AC
  Filled 2021-05-21: qty 1

## 2021-05-21 MED ORDER — MENTHOL 3 MG MT LOZG: 1.0000 | LOZENGE | OROMUCOSAL | Status: DC | PRN

## 2021-05-21 MED ORDER — ONDANSETRON HCL 4 MG/2ML IJ SOLN
4.0000 mg | Freq: Four times a day (QID) | INTRAMUSCULAR | Status: DC | PRN
Start: 2021-05-21 — End: 2021-05-22

## 2021-05-21 MED ORDER — DEXAMETHASONE SODIUM PHOSPHATE 10 MG/ML IJ SOLN
INTRAMUSCULAR | Status: DC | PRN
  Administered 2021-05-21: 10 mg via INTRAVENOUS

## 2021-05-21 MED ORDER — PHENYLEPHRINE 40 MCG/ML (10ML) SYRINGE FOR IV PUSH (FOR BLOOD PRESSURE SUPPORT)
PREFILLED_SYRINGE | INTRAVENOUS | Status: AC
  Filled 2021-05-21: qty 10

## 2021-05-21 MED ORDER — 0.9 % SODIUM CHLORIDE (POUR BTL) OPTIME
TOPICAL | Status: DC | PRN
  Administered 2021-05-21: 500 mL

## 2021-05-21 MED ORDER — SIMETHICONE 80 MG PO CHEW: 80.0000 mg | CHEWABLE_TABLET | Freq: Four times a day (QID) | ORAL | Status: DC | PRN

## 2021-05-21 MED ORDER — ROCURONIUM BROMIDE 10 MG/ML (PF) SYRINGE
PREFILLED_SYRINGE | INTRAVENOUS | Status: AC
  Filled 2021-05-21: qty 10

## 2021-05-21 MED ORDER — FENTANYL CITRATE (PF) 100 MCG/2ML IJ SOLN: 25.0000 ug | INTRAMUSCULAR | Status: DC | PRN

## 2021-05-21 MED ORDER — LIDOCAINE 2% (20 MG/ML) 5 ML SYRINGE
INTRAMUSCULAR | Status: DC | PRN
  Administered 2021-05-21: 100 mg via INTRAVENOUS

## 2021-05-21 MED ORDER — ACETAMINOPHEN 500 MG PO TABS
ORAL_TABLET | ORAL | Status: AC
  Filled 2021-05-21: qty 2

## 2021-05-21 MED ORDER — MIDAZOLAM HCL 2 MG/2ML IJ SOLN
INTRAMUSCULAR | Status: DC | PRN
  Administered 2021-05-21: 2 mg via INTRAVENOUS

## 2021-05-21 MED ORDER — OXYCODONE-ACETAMINOPHEN 5-325 MG PO TABS
ORAL_TABLET | ORAL | Status: AC
  Filled 2021-05-21: qty 2

## 2021-05-21 MED ORDER — DROPERIDOL 2.5 MG/ML IJ SOLN: 0.6250 mg | Freq: Once | INTRAMUSCULAR | Status: DC | PRN

## 2021-05-21 MED ORDER — POVIDONE-IODINE 10 % EX SWAB: 2.0000 "application " | Freq: Once | CUTANEOUS | Status: DC

## 2021-05-21 MED ORDER — KETOROLAC TROMETHAMINE 30 MG/ML IJ SOLN
30.0000 mg | Freq: Four times a day (QID) | INTRAMUSCULAR | Status: DC
  Administered 2021-05-21 – 2021-05-22 (×2): 30 mg via INTRAVENOUS

## 2021-05-21 MED ORDER — LACTATED RINGERS IV SOLN: INTRAVENOUS | Status: DC

## 2021-05-21 MED ORDER — MIDAZOLAM HCL 2 MG/2ML IJ SOLN
INTRAMUSCULAR | Status: AC
  Filled 2021-05-21: qty 2

## 2021-05-21 MED ORDER — PROPOFOL 10 MG/ML IV BOLUS
INTRAVENOUS | Status: DC | PRN
  Administered 2021-05-21: 170 mg via INTRAVENOUS

## 2021-05-21 MED ORDER — ONDANSETRON HCL 4 MG PO TABS: 4.0000 mg | ORAL_TABLET | Freq: Four times a day (QID) | ORAL | Status: DC | PRN

## 2021-05-21 MED ORDER — ROCURONIUM BROMIDE 10 MG/ML (PF) SYRINGE
PREFILLED_SYRINGE | INTRAVENOUS | Status: DC | PRN
  Administered 2021-05-21 (×2): 50 mg via INTRAVENOUS

## 2021-05-21 MED ORDER — HEMOSTATIC AGENTS (NO CHARGE) OPTIME
TOPICAL | Status: DC | PRN
  Administered 2021-05-21: 1

## 2021-05-21 MED ORDER — FENTANYL CITRATE (PF) 250 MCG/5ML IJ SOLN
INTRAMUSCULAR | Status: AC
  Filled 2021-05-21: qty 5

## 2021-05-21 MED ORDER — EPHEDRINE SULFATE-NACL 50-0.9 MG/10ML-% IV SOSY
PREFILLED_SYRINGE | INTRAVENOUS | Status: DC | PRN
  Administered 2021-05-21: 10 mg via INTRAVENOUS
  Administered 2021-05-21: 5 mg via INTRAVENOUS

## 2021-05-21 MED ORDER — ONDANSETRON HCL 4 MG/2ML IJ SOLN
INTRAMUSCULAR | Status: AC
  Filled 2021-05-21: qty 12

## 2021-05-21 MED ORDER — CEFAZOLIN SODIUM-DEXTROSE 2-4 GM/100ML-% IV SOLN
INTRAVENOUS | Status: AC
  Filled 2021-05-21: qty 100

## 2021-05-21 MED ORDER — ACETAMINOPHEN 500 MG PO TABS: 1000.0000 mg | ORAL_TABLET | ORAL | Status: DC

## 2021-05-21 MED ORDER — BUPIVACAINE HCL 0.5 % IJ SOLN
INTRAMUSCULAR | Status: DC | PRN
  Administered 2021-05-21: 20 mL

## 2021-05-21 MED ORDER — KETOROLAC TROMETHAMINE 30 MG/ML IJ SOLN
INTRAMUSCULAR | Status: AC
  Filled 2021-05-21: qty 1

## 2021-05-21 MED ORDER — ENOXAPARIN SODIUM 40 MG/0.4ML IJ SOSY
40.0000 mg | PREFILLED_SYRINGE | INTRAMUSCULAR | Status: DC
  Administered 2021-05-22: 40 mg via SUBCUTANEOUS

## 2021-05-21 MED ORDER — SCOPOLAMINE 1 MG/3DAYS TD PT72
MEDICATED_PATCH | TRANSDERMAL | Status: AC
  Filled 2021-05-21: qty 1

## 2021-05-21 MED ORDER — FENTANYL CITRATE (PF) 250 MCG/5ML IJ SOLN
INTRAMUSCULAR | Status: DC | PRN
  Administered 2021-05-21: 50 ug via INTRAVENOUS
  Administered 2021-05-21: 25 ug via INTRAVENOUS
  Administered 2021-05-21: 50 ug via INTRAVENOUS
  Administered 2021-05-21: 25 ug via INTRAVENOUS
  Administered 2021-05-21: 50 ug via INTRAVENOUS
  Administered 2021-05-21 (×2): 25 ug via INTRAVENOUS

## 2021-05-21 MED ORDER — KETOROLAC TROMETHAMINE 30 MG/ML IJ SOLN
INTRAMUSCULAR | Status: DC | PRN
  Administered 2021-05-21: 30 mg via INTRAVENOUS

## 2021-05-21 MED ORDER — SCOPOLAMINE 1 MG/3DAYS TD PT72
1.0000 | MEDICATED_PATCH | Freq: Once | TRANSDERMAL | Status: DC
  Administered 2021-05-21: 1.5 mg via TRANSDERMAL

## 2021-05-21 MED ORDER — STERILE WATER FOR IRRIGATION IR SOLN
Status: DC | PRN
  Administered 2021-05-21: 3000 mL

## 2021-05-21 MED ORDER — KETOROLAC TROMETHAMINE 15 MG/ML IJ SOLN: 15.0000 mg | INTRAMUSCULAR | Status: AC

## 2021-05-21 MED ORDER — PHENYLEPHRINE 40 MCG/ML (10ML) SYRINGE FOR IV PUSH (FOR BLOOD PRESSURE SUPPORT)
PREFILLED_SYRINGE | INTRAVENOUS | Status: DC | PRN
  Administered 2021-05-21: 40 ug via INTRAVENOUS

## 2021-05-21 MED ORDER — LIDOCAINE HCL (PF) 2 % IJ SOLN
INTRAMUSCULAR | Status: AC
  Filled 2021-05-21: qty 10

## 2021-05-21 MED ORDER — OXYCODONE HCL 5 MG PO TABS: 5.0000 mg | ORAL_TABLET | Freq: Once | ORAL | Status: DC | PRN

## 2021-05-21 MED ORDER — GABAPENTIN 100 MG PO CAPS
100.0000 mg | ORAL_CAPSULE | Freq: Two times a day (BID) | ORAL | Status: DC
  Administered 2021-05-21: 100 mg via ORAL

## 2021-05-21 MED ORDER — SODIUM CHLORIDE 0.9 % IV SOLN: INTRAVENOUS | Status: DC

## 2021-05-21 MED ORDER — SODIUM CHLORIDE 0.9 % IR SOLN
Status: DC | PRN
  Administered 2021-05-21: 1

## 2021-05-21 MED ORDER — OXYCODONE-ACETAMINOPHEN 5-325 MG PO TABS
ORAL_TABLET | ORAL | Status: AC
  Filled 2021-05-21: qty 1

## 2021-05-21 MED ORDER — EPHEDRINE 5 MG/ML INJ
INTRAVENOUS | Status: AC
  Filled 2021-05-21: qty 5

## 2021-05-21 MED ORDER — ONDANSETRON HCL 4 MG/2ML IJ SOLN
INTRAMUSCULAR | Status: DC | PRN
  Administered 2021-05-21: 4 mg via INTRAVENOUS

## 2021-05-21 MED ORDER — IBUPROFEN 200 MG PO TABS: 600.0000 mg | ORAL_TABLET | Freq: Four times a day (QID) | ORAL | Status: DC

## 2021-05-21 SURGICAL SUPPLY — 53 items
APPLICATOR ARISTA FLEXITIP XL (MISCELLANEOUS) ×1 IMPLANT
COVER MAYO STAND STRL (DRAPES) ×3 IMPLANT
COVER SURGICAL LIGHT HANDLE (MISCELLANEOUS) ×3 IMPLANT
DEFOGGER SCOPE WARMER CLEARIFY (MISCELLANEOUS) ×3 IMPLANT
DERMABOND ADVANCED (GAUZE/BANDAGES/DRESSINGS) ×1
DERMABOND ADVANCED .7 DNX12 (GAUZE/BANDAGES/DRESSINGS) ×2 IMPLANT
DURAPREP 26ML APPLICATOR (WOUND CARE) ×4 IMPLANT
GLOVE SURG ENC MOIS LTX SZ6 (GLOVE) ×9 IMPLANT
GLOVE SURG ENC MOIS LTX SZ6.5 (GLOVE) ×1 IMPLANT
GLOVE SURG UNDER POLY LF SZ6.5 (GLOVE) ×4 IMPLANT
GLOVE SURG UNDER POLY LF SZ7 (GLOVE) ×2 IMPLANT
GOWN STRL REUS W/ TWL LRG LVL3 (GOWN DISPOSABLE) ×8 IMPLANT
GOWN STRL REUS W/TWL LRG LVL3 (GOWN DISPOSABLE) ×4
HEMOSTAT ARISTA ABSORB 3G PWDR (HEMOSTASIS) ×1 IMPLANT
HIBICLENS CHG 4% 4OZ BTL (MISCELLANEOUS) ×4 IMPLANT
IV NS 1000ML (IV SOLUTION) ×1
IV NS 1000ML BAXH (IV SOLUTION) IMPLANT
KIT TURNOVER CYSTO (KITS) ×3 IMPLANT
L-HOOK LAP DISP 36CM (ELECTROSURGICAL) ×3
LEGGING LITHOTOMY PAIR STRL (DRAPES) ×1 IMPLANT
LHOOK LAP DISP 36CM (ELECTROSURGICAL) ×2 IMPLANT
MANIPULATOR ADVINCU DEL 3.0 PL (MISCELLANEOUS) ×1 IMPLANT
NEEDLE INSUFFLATION 120MM (ENDOMECHANICALS) ×3 IMPLANT
NS IRRIG 500ML POUR BTL (IV SOLUTION) ×1 IMPLANT
PACK LAPAROSCOPY BASIN (CUSTOM PROCEDURE TRAY) ×3 IMPLANT
PACK TRENDGUARD 450 HYBRID PRO (MISCELLANEOUS) IMPLANT
PENCIL BUTTON HOLSTER BLD 10FT (ELECTRODE) ×3 IMPLANT
POUCH LAPAROSCOPIC INSTRUMENT (MISCELLANEOUS) ×1 IMPLANT
PROTECTOR NERVE ULNAR (MISCELLANEOUS) ×6 IMPLANT
SCISSORS LAP 5X35 DISP (ENDOMECHANICALS) ×1 IMPLANT
SEALER TISSUE G2 CVD JAW 35 (ENDOMECHANICALS) ×2 IMPLANT
SEALER TISSUE G2 CVD JAW 45CM (ENDOMECHANICALS) ×1
SET CYSTO W/LG BORE CLAMP LF (SET/KITS/TRAYS/PACK) ×3 IMPLANT
SET SUCTION IRRIG HYDROSURG (IRRIGATION / IRRIGATOR) ×3 IMPLANT
SET TUBE SMOKE EVAC HIGH FLOW (TUBING) ×3 IMPLANT
SLEEVE ADV FIXATION 5X100MM (TROCAR) ×6 IMPLANT
SUT VIC AB 0 CT1 27 (SUTURE) ×1
SUT VIC AB 0 CT1 27XBRD ANBCTR (SUTURE) ×2 IMPLANT
SUT VICRYL 4-0 PS2 18IN ABS (SUTURE) ×6 IMPLANT
SUT VLOC 180 0 9IN  GS21 (SUTURE) ×1
SUT VLOC 180 0 9IN GS21 (SUTURE) ×2 IMPLANT
SYR 10ML LL (SYRINGE) ×3 IMPLANT
SYR 50ML LL SCALE MARK (SYRINGE) ×3 IMPLANT
SYSTEM CARTER THOMASON II (TROCAR) ×3 IMPLANT
TOWEL OR 17X26 10 PK STRL BLUE (TOWEL DISPOSABLE) ×5 IMPLANT
TRAY FOLEY W/BAG SLVR 14FR LF (SET/KITS/TRAYS/PACK) ×1 IMPLANT
TRENDGUARD 450 HYBRID PRO PACK (MISCELLANEOUS) ×3
TROCAR ADV FIXATION 5X100MM (TROCAR) ×3 IMPLANT
TROCAR BLADELESS OPT 5 100 (ENDOMECHANICALS) ×1 IMPLANT
TROCAR XCEL NON BLADE 8MM B8LT (ENDOMECHANICALS) ×3 IMPLANT
UNDERPAD 30X36 HEAVY ABSORB (UNDERPADS AND DIAPERS) ×3 IMPLANT
WARMER LAPAROSCOPE (MISCELLANEOUS) ×3 IMPLANT
WATER STERILE IRR 3000ML UROMA (IV SOLUTION) ×1 IMPLANT

## 2021-05-21 NOTE — Anesthesia Preprocedure Evaluation (Addendum)
Anesthesia Evaluation  ?Patient identified by MRN, date of birth, ID band ?Patient awake ? ? ? ?Reviewed: ?Allergy & Precautions, NPO status , Patient's Chart, lab work & pertinent test results ? ?History of Anesthesia Complications ?Negative for: history of anesthetic complications ? ?Airway ?Mallampati: II ? ?TM Distance: >3 FB ?Neck ROM: Full ? ? ? Dental ?no notable dental hx. ? ?  ?Pulmonary ?sleep apnea and Continuous Positive Airway Pressure Ventilation ,  ?  ?Pulmonary exam normal ? ? ? ? ? ? ? Cardiovascular ?negative cardio ROS ?Normal cardiovascular exam ? ? ?  ?Neuro/Psych ? Headaches, Anxiety Depression   ? GI/Hepatic ?Neg liver ROS, hiatal hernia, GERD  Controlled and Medicated,  ?Endo/Other  ?negative endocrine ROS ? Renal/GU ?negative Renal ROS  ?negative genitourinary ?  ?Musculoskeletal ? ?(+) Arthritis ,  ? Abdominal ?  ?Peds ? Hematology ?negative hematology ROS ?(+)   ?Anesthesia Other Findings ?Day of surgery medications reviewed with patient. ? Reproductive/Obstetrics ?Postmenopausal bleeding ? ?  ? ? ? ? ? ? ? ? ? ? ? ? ? ?  ?  ? ? ? ? ? ? ? ?Anesthesia Physical ?Anesthesia Plan ? ?ASA: 2 ? ?Anesthesia Plan: General  ? ?Post-op Pain Management: Tylenol PO (pre-op)* and Toradol IV (intra-op)*  ? ?Induction: Intravenous ? ?PONV Risk Score and Plan: 4 or greater and Treatment may vary due to age or medical condition, Ondansetron, Dexamethasone, Midazolam and Scopolamine patch - Pre-op ? ?Airway Management Planned: Oral ETT ? ?Additional Equipment: None ? ?Intra-op Plan:  ? ?Post-operative Plan: Extubation in OR ? ?Informed Consent: I have reviewed the patients History and Physical, chart, labs and discussed the procedure including the risks, benefits and alternatives for the proposed anesthesia with the patient or authorized representative who has indicated his/her understanding and acceptance.  ? ? ? ?Dental advisory given ? ?Plan Discussed with:  CRNA ? ?Anesthesia Plan Comments:   ? ? ? ? ? ?Anesthesia Quick Evaluation ? ?

## 2021-05-21 NOTE — Interval H&P Note (Signed)
History and Physical Interval Note: ? ?05/21/2021 ?12:12 PM ? ?Tonya Phillips  has presented today for surgery, with the diagnosis of POST MENOPAUSAL BLEEDING,RIGHT OVARIAN CYST.  The various methods of treatment have been discussed with the patient and family. After consideration of risks, benefits and other options for treatment, the patient has consented to  Procedure(s): ?TOTAL LAPAROSCOPIC HYSTERECTOMY WITH SALPING-OOPHORECTOMY (Bilateral) ?CYSTOSCOPY (N/A) as a surgical intervention.  The patient's history has been reviewed, patient examined, no change in status, stable for surgery.  I have reviewed the patient's chart and labs.  Questions were answered to the patient's satisfaction.   ? ? ?Radene Gunning ? ? ?

## 2021-05-21 NOTE — Anesthesia Procedure Notes (Signed)
Procedure Name: Intubation ?Date/Time: 05/21/2021 1:34 PM ?Performed by: Clearnce Sorrel, CRNA ?Pre-anesthesia Checklist: Patient identified, Emergency Drugs available, Suction available and Patient being monitored ?Patient Re-evaluated:Patient Re-evaluated prior to induction ?Oxygen Delivery Method: Circle System Utilized ?Preoxygenation: Pre-oxygenation with 100% oxygen ?Induction Type: IV induction ?Ventilation: Mask ventilation without difficulty ?Laryngoscope Size: Mac and 3 ?Grade View: Grade I ?Tube type: Oral ?Tube size: 7.0 mm ?Number of attempts: 1 ?Airway Equipment and Method: Stylet and Oral airway ?Placement Confirmation: ETT inserted through vocal cords under direct vision, positive ETCO2 and breath sounds checked- equal and bilateral ?Secured at: 22 cm ?Tube secured with: Tape ?Dental Injury: Teeth and Oropharynx as per pre-operative assessment  ? ? ? ? ?

## 2021-05-21 NOTE — Transfer of Care (Signed)
Immediate Anesthesia Transfer of Care Note ? ?Patient: Tonya Phillips ? ?Procedure(s) Performed: TOTAL LAPAROSCOPIC HYSTERECTOMY WITH RIGHT SALPING-OOPHORECTOMY, LEFT SALPINGECTOMY (Abdomen) ?CYSTOSCOPY (Urethra) ? ?Patient Location: PACU ? ?Anesthesia Type:General ? ?Level of Consciousness: awake, alert  and oriented ? ?Airway & Oxygen Therapy: Patient Spontanous Breathing ? ?Post-op Assessment: Report given to RN and Post -op Vital signs reviewed and stable ? ?Post vital signs: Reviewed and stable ? ?Last Vitals:  ?Vitals Value Taken Time  ?BP 116/53 05/21/21 1523  ?Temp    ?Pulse 81 05/21/21 1527  ?Resp 21 05/21/21 1527  ?SpO2 90 % 05/21/21 1527  ?Vitals shown include unvalidated device data. ? ?Last Pain:  ?Vitals:  ? 05/21/21 1125  ?TempSrc: Oral  ?PainSc: 0-No pain  ?   ? ?Patients Stated Pain Goal: 6 (05/21/21 1125) ? ?Complications: No notable events documented. ?

## 2021-05-21 NOTE — Anesthesia Postprocedure Evaluation (Signed)
Anesthesia Post Note ? ?Patient: Tonya Phillips ? ?Procedure(s) Performed: TOTAL LAPAROSCOPIC HYSTERECTOMY WITH RIGHT SALPING-OOPHORECTOMY, LEFT SALPINGECTOMY (Abdomen) ?CYSTOSCOPY (Urethra) ? ?  ? ?Patient location during evaluation: PACU ?Anesthesia Type: General ?Level of consciousness: awake and alert ?Pain management: pain level controlled ?Vital Signs Assessment: post-procedure vital signs reviewed and stable ?Respiratory status: spontaneous breathing, nonlabored ventilation, respiratory function stable and patient connected to nasal cannula oxygen ?Cardiovascular status: blood pressure returned to baseline and stable ?Postop Assessment: no apparent nausea or vomiting ?Anesthetic complications: no ? ? ?No notable events documented. ? ?Last Vitals:  ?Vitals:  ? 05/21/21 1530 05/21/21 1545  ?BP: 110/60 117/66  ?Pulse: 80 79  ?Resp: 13 10  ?Temp:    ?SpO2: 93% 95%  ?  ?Last Pain:  ?Vitals:  ? 05/21/21 1125  ?TempSrc: Oral  ?PainSc: 0-No pain  ? ? ?  ?  ?  ?  ?  ?  ? ?Christorpher Hisaw ? ? ? ? ?

## 2021-05-21 NOTE — Op Note (Signed)
Tonya Phillips ?PROCEDURE DATE: 05/21/2021 ? ?PREOPERATIVE DIAGNOSIS: Right complex ovarian cyst, multiple risk factors for endometrial cancer, postmenopausal bleeding ?POSTOPERATIVE DIAGNOSIS: The same ?PROCEDURE: Total laparoscopic hysterectomy, right salpingo-oophorectomy, left salpingectomy cystoscopy ?SURGEON:  Dr. Radene Gunning ?ASSISTANT:  Dr. Edwinna Areola.  An experienced assistant was required given the standard of surgical care given the complexity of the case.  This assistant was needed for exposure, dissection, suctioning, retraction, instrument exchange, and for overall help during the procedure. ? ?INDICATIONS: 51 y.o. G0P0000  here for definitive surgical management secondary to the indications listed under preoperative diagnoses; please see preoperative note for further details.  Risks of surgery were discussed with the patient including but not limited to: bleeding which may require transfusion or reoperation; infection which may require antibiotics; injury to bowel, bladder, ureters or other surrounding organs; need for additional procedures; thromboembolic phenomenon, incisional problems and other postoperative/anesthesia complications. Written informed consent was obtained.   ? ?FINDINGS:  Normal appearing cervix, uterus, tubes. Left ovary normal. Right ovary had a separate 2cm fibroma off the distal edge of it.  ? ?ANESTHESIA:    General ?INTRAVENOUS FLUIDS: 1000  ml ?ESTIMATED BLOOD LOSS:10 ml ?URINE OUTPUT: 200 ml ? ? ?SPECIMENS: Uterus, cervix, bilateral fallopian tubes, right ovary ?COMPLICATIONS: None immediate ? ?PROCEDURE IN DETAIL:  The patient received intravenous antibiotics and had sequential compression devices applied to her lower extremities while in the preoperative area.  She was then taken to the operating room where general anesthesia was administered and was found to be adequate.  She was placed in the dorsal lithotomy position, and was prepped and draped in a sterile manner.    ? ?After an adequate timeout was performed, an avincula uterine manipulator was placed at this time.  A Foley catheter was inserted into her bladder and attached to constant drainage.  ? ?Attention turned to patient's abdomen. Skin incision made with the scalpel in the umbilicus and closed technique used for entry using the veress needle. Opening pressure was 3.  Pneumoperitoneum achieved to a pressure of 15.  0 degree laparoscope was used with the 4m optiview port and trocar to enter the cavity under direct visualization. Inspection below the point of entry revealed no evidence of bowel injury. Patient placed in steep trendelenburg. Lateral 5 mm ports were placed. A suprapubic port was placed 4 finger-breadths above the pubic bone.  ? ?The pelvis was then carefully examined.  Attention was turned to the fallopian tubes; these were freed from the underlying mesosalpinx and the uterine attachments using the Enseal device.  Attention was turned to the infundibulopelvic ligament on the patient's right side, which was clamped using the Enseal and ligated after identifying the ureter which was well below the area of surgery.  Good hemostasis was noted. The round and broad ligaments were then clamped and transected with the Enseal. ? ?The bilateral round and broad ligaments were then clamped and transected with the Enseal device.  The bladder flap was created.  The ureters were noted to be safely away from the area of dissection. The uterine arteries were then skeletonized.  The uterine vessels were clamped and ligated using the Enseal.  Good hemostasis was noted overall.  The uterosacral and cardinal ligaments were clamped, cut and ligated bilaterally.  Attention was then turned to the cervicovaginal junction, and the bovie hook was used to transect the cervix from the surrounding vagina using the ring of the Avincula as a guide. This was done circumferentially allowing total hysterectomy.  The  uterus was then removed  from the vagina and the vaginal cuff incision was then closed with 0 v lock.  Overall excellent hemostasis was noted.  The ureters were reexamined bilaterally and were pulsating normally. The abdominal pressure was reduced and hemostasis was confirmed.  Doylene Bode was applied.  ? ?Cystoscopy showed bilateral ureteral jets.  No stitches were visualized in the bladder during cystoscopy.  All trocars were removed under direct visualization, and the abdomen was desufflated. All skin incisions were closed with 4-0 Vicryl subcuticular stitches and Dermabond. The patient tolerated the procedures well.   ? ?All instruments, needles, and sponge counts were correct x 2. The patient was taken to the recovery room awake, extubated and in stable condition.  ? ?Radene Gunning, MD, FACOG ?Obstetrician Social research officer, government, Faculty Practice ?[facility] ? ? ? ?

## 2021-05-22 ENCOUNTER — Encounter (HOSPITAL_BASED_OUTPATIENT_CLINIC_OR_DEPARTMENT_OTHER): Payer: Self-pay | Admitting: Obstetrics and Gynecology

## 2021-05-22 DIAGNOSIS — D251 Intramural leiomyoma of uterus: Secondary | ICD-10-CM | POA: Diagnosis not present

## 2021-05-22 DIAGNOSIS — N950 Postmenopausal bleeding: Secondary | ICD-10-CM | POA: Diagnosis not present

## 2021-05-22 DIAGNOSIS — D270 Benign neoplasm of right ovary: Secondary | ICD-10-CM | POA: Diagnosis not present

## 2021-05-22 DIAGNOSIS — N72 Inflammatory disease of cervix uteri: Secondary | ICD-10-CM | POA: Diagnosis not present

## 2021-05-22 DIAGNOSIS — N83201 Unspecified ovarian cyst, right side: Secondary | ICD-10-CM | POA: Diagnosis not present

## 2021-05-22 LAB — CBC
HCT: 38.5 % (ref 36.0–46.0)
Hemoglobin: 13.1 g/dL (ref 12.0–15.0)
MCH: 31 pg (ref 26.0–34.0)
MCHC: 34 g/dL (ref 30.0–36.0)
MCV: 91.2 fL (ref 80.0–100.0)
Platelets: 190 10*3/uL (ref 150–400)
RBC: 4.22 MIL/uL (ref 3.87–5.11)
RDW: 13.4 % (ref 11.5–15.5)
WBC: 10.1 10*3/uL (ref 4.0–10.5)
nRBC: 0 % (ref 0.0–0.2)

## 2021-05-22 MED ORDER — OXYCODONE HCL 5 MG PO TABS: 5.0000 mg | ORAL_TABLET | ORAL | 0 refills | Status: DC | PRN

## 2021-05-22 MED ORDER — KETOROLAC TROMETHAMINE 30 MG/ML IJ SOLN
INTRAMUSCULAR | Status: AC
  Filled 2021-05-22: qty 1

## 2021-05-22 MED ORDER — OXYCODONE-ACETAMINOPHEN 5-325 MG PO TABS
ORAL_TABLET | ORAL | Status: AC
  Filled 2021-05-22: qty 2

## 2021-05-22 MED ORDER — ACETAMINOPHEN 500 MG PO TABS: 1000.0000 mg | ORAL_TABLET | Freq: Three times a day (TID) | ORAL | 1 refills | Status: DC | PRN

## 2021-05-22 MED ORDER — IBUPROFEN 800 MG PO TABS: 800.0000 mg | ORAL_TABLET | Freq: Three times a day (TID) | ORAL | 1 refills | Status: DC | PRN

## 2021-05-22 MED ORDER — ENOXAPARIN SODIUM 40 MG/0.4ML IJ SOSY
PREFILLED_SYRINGE | INTRAMUSCULAR | Status: AC
  Filled 2021-05-22: qty 0.4

## 2021-05-22 NOTE — Discharge Summary (Signed)
Gynecology Physician Discharge Summary  ?Patient ID: ?Tonya Phillips ?MRN: [id number] ?DOB/AGE: 1970-06-22 51 y.o. ? ?Admit date: 05/21/2021 ?Discharge date: 05/22/2021 ? ?Admission Diagnoses: ?H/O: hysterectomy [Z90.710] ?Postmenopausal bleeding ?Right ovarian cyst ? ?Discharge Diagnoses:  ?Same ? ?Procedures: Total laparoscopic hysterectomy, right salpingo-oopherectomy, left salpingectomy, cystoscopy ? ?Consults: None ? ?Hospital Course:  ?Tonya Phillips is a 51 y.o. G0P0000 who presented for hysterectomy. Her surgery proceeded without complication. Please see operative note for details. She was admitted for extended recovery which was planned in her case. She had no acute events. She was able to ambulate, tolerate PO and void spontaneously. Her pain was controlled with PO medications. On POD1 she continued to meet expected goals and felt prepared for discharge. Postoperative HgB was appropriate. She was deemed stable for discharge to home with outpatient follow up. ? ?Discharge Exam: ?Temp:  [97.7 ?F (36.5 ?C)-98.5 ?F (36.9 ?C)] 97.7 ?F (36.5 ?C) (04/06 2952) ?Pulse Rate:  [66-86] 70 (04/06 0626) ?Resp:  [10-20] 18 (04/06 0626) ?BP: (94-145)/(53-89) 94/55 (04/06 8413) ?SpO2:  [91 %-100 %] 96 % (04/06 0626) ?Weight:  [85.4 kg] 85.4 kg (04/05 1125) ?Physical Examination: ?CONSTITUTIONAL: Well-developed, well-nourished female in no acute distress.  ?HENT:  Normocephalic, atraumatic, External right and left ear normal.  ?EYES: Conjunctivae and EOM are normal. Pupils are equal, round, and reactive to light. No scleral icterus.  ?NECK: Normal range of motion, supple, no masses ?SKIN: Skin is warm and dry. No rash noted. Not diaphoretic. No erythema. No pallor. ?NEUROLOGIC: Alert and oriented to person, place, and time. Normal reflexes, muscle tone coordination. No cranial nerve deficit noted. ?PSYCHIATRIC: Normal mood and affect. Normal behavior. Normal judgment and thought content. ?CARDIOVASCULAR: Normal heart rate noted,  regular rhythm ?RESPIRATORY: Effort and breath sounds normal, no problems with respiration noted ?MUSCULOSKELETAL: Normal range of motion. No edema and no tenderness. 2+ distal pulses. ?ABDOMEN: Soft, nontender, nondistended, Incisions all c/d/I x4 ? ?Significant Diagnostic Studies:  ?Results for orders placed or performed during the hospital encounter of 05/21/21 (from the past 168 hour(s))  ?CBC  ? Collection Time: 05/19/21  8:23 AM  ?Result Value Ref Range  ? WBC 8.6 4.0 - 10.5 K/uL  ? RBC 4.62 3.87 - 5.11 MIL/uL  ? Hemoglobin 14.2 12.0 - 15.0 g/dL  ? HCT 41.7 36.0 - 46.0 %  ? MCV 90.3 80.0 - 100.0 fL  ? MCH 30.7 26.0 - 34.0 pg  ? MCHC 34.1 30.0 - 36.0 g/dL  ? RDW 13.2 11.5 - 15.5 %  ? Platelets 218 150 - 400 K/uL  ? nRBC 0.0 0.0 - 0.2 %  ?Type and screen [place]  ? Collection Time: 05/19/21  8:23 AM  ?Result Value Ref Range  ? ABO/RH(D) A POS   ? Antibody Screen NEG   ? Sample Expiration 05/24/2021,2359   ? Extend sample reason    ?  NO TRANSFUSIONS OR PREGNANCY IN THE PAST 3 MONTHS ?Performed at [facility], [place] [address] [place], [place] [address] ?  ?ABO/Rh  ? Collection Time: 05/21/21 11:59 AM  ?Result Value Ref Range  ? ABO/RH(D)    ?  A POS ?Performed at [facility], [place] [address], [place], [place] [address] ?  ?CBC  ? Collection Time: 05/22/21 12:10 AM  ?Result Value Ref Range  ? WBC 10.1 4.0 - 10.5 K/uL  ? RBC 4.22 3.87 - 5.11 MIL/uL  ? Hemoglobin 13.1 12.0 - 15.0 g/dL  ? HCT 38.5 36.0 -  46.0 %  ? MCV 91.2 80.0 - 100.0 fL  ? MCH 31.0 26.0 - 34.0 pg  ? MCHC 34.0 30.0 - 36.0 g/dL  ? RDW 13.4 11.5 - 15.5 %  ? Platelets 190 150 - 400 K/uL  ? nRBC 0.0 0.0 - 0.2 %  ? ?No results found. ? ?Future Appointments  ?Date Time Provider [place]  ?06/10/2021  9:50 AM Madilyn Fireman, Rene Kocher, MD [facility] None  ? ? ?Discharge Condition: Stable ? ?Discharge disposition: 01-Home or Self Care ? ? ? ? ? ? ?Discharge Instructions   ? ? Activity as tolerated -  No restrictions   Complete by: As directed ?  ? No lifting greater than 25 lbs, nothing in the vagina, no tub baths  ? Call MD for:  difficulty breathing, headache or visual disturbances   Complete by: As directed ?  ? Call MD for:  persistant nausea and vomiting   Complete by: As directed ?  ? Call MD for:  redness, tenderness, or signs of infection (pain, swelling, redness, odor or green/yellow discharge around incision site)   Complete by: As directed ?  ? Call MD for:  severe uncontrolled pain   Complete by: As directed ?  ? Call MD for:  temperature >100.4   Complete by: As directed ?  ? Diet - low sodium heart healthy   Complete by: As directed ?  ? Driving Restrictions   Complete by: As directed ?  ? When not taking narcotic pain medication and would not hesitate to use the breaks, usually about 7 days  ? May shower / Bathe   Complete by: As directed ?  ? No wound care   Complete by: As directed ?  ? You have dermabond (which is like super glue) over your incisions. You have no additional care for them. The dermabond will dissolve on its own. You may get the incisions wet.  ? ?  ? ?Allergies as of 05/22/2021   ?No Known Allergies ?  ? ?  ?Medication List  ?  ? ?TAKE these medications   ? ?acetaminophen 500 MG tablet ?Commonly known as: TYLENOL ?Take 2 tablets (1,000 mg total) by mouth every 8 (eight) hours as needed. ?  ?AMBULATORY NON FORMULARY MEDICATION ?Medication Name: CPAP, humidifier and supplies set to 12 cm water pressure. . Dx OSA. [employer]. ?  ?Biotin 2500 MCG Caps ?Take by mouth. ?  ?fluticasone 50 MCG/ACT nasal spray ?Commonly known as: FLONASE ?Place into both nostrils daily. ?  ?glucosamine-chondroitin 500-400 MG tablet ?Take 1 tablet by mouth 3 (three) times daily. ?  ?ibuprofen 800 MG tablet ?Commonly known as: ADVIL ?Take 1 tablet (800 mg total) by mouth 3 (three) times daily with meals as needed for headache or moderate pain. ?  ?IRON-VITAMIN C PO ?Take by mouth. ?  ?meloxicam 15 MG  tablet ?Commonly known as: MOBIC ?Take 15 mg by mouth as needed. ?  ?oxyCODONE 5 MG immediate release tablet ?Commonly known as: Roxicodone ?Take 1 tablet (5 mg total) by mouth every 4 (four) hours as needed for severe pain. ?  ?promethazine 25 MG tablet ?Commonly known as: PHENERGAN ?Take 1 tablet (25 mg total) by mouth every 6 (six) hours as needed for nausea or vomiting. ?  ?RA CALCIUM CITRATE PLUS VIT D PO ?Take by mouth. ?  ?RABEprazole 20 MG tablet ?Commonly known as: ACIPHEX ?Take 20 mg by mouth daily. ?  ?rizatriptan 10 MG tablet ?Commonly known as: MAXALT ?TAKE 1 TABLET (10  MG TOTAL) BY MOUTH AS NEEDED FOR MIGRAINE. APPT FOR REFILLS ?  ?venlafaxine XR 150 MG 24 hr capsule ?Commonly known as: EFFEXOR-XR ?Take 1 capsule (150 mg total) by mouth daily with breakfast. ?  ?vitamin C 250 MG tablet ?Commonly known as: ASCORBIC ACID ?Take 250 mg by mouth daily. ?  ? ?  ? ? Follow-up Information   ? ? [facility] [place] [facility].   ?Specialty: Obstetrics and Gynecology ?Why: 4-8 weeks postoperative check - the office will call to schedule. ?Contact information: ?[address], [address] ?[place] ?[phone] ? ?  ?  ? ?  ?  ? ?  ? ? ?Total discharge time: 20 minutes  ? ?Signed: ?Radene Gunning M.D. ?05/22/2021, 7:39 AM ? ?

## 2021-05-23 LAB — SURGICAL PATHOLOGY

## 2021-05-26 ENCOUNTER — Encounter: Payer: Self-pay | Admitting: Obstetrics and Gynecology

## 2021-05-26 DIAGNOSIS — B370 Candidal stomatitis: Secondary | ICD-10-CM

## 2021-05-26 MED ORDER — NYSTATIN 100000 UNIT/ML MT SUSP: 5.0000 mL | Freq: Three times a day (TID) | OROMUCOSAL | 0 refills | Status: AC | PRN

## 2021-05-27 ENCOUNTER — Other Ambulatory Visit: Payer: Self-pay

## 2021-05-27 DIAGNOSIS — F418 Other specified anxiety disorders: Secondary | ICD-10-CM

## 2021-05-27 MED ORDER — VENLAFAXINE HCL ER 150 MG PO CP24: 150.0000 mg | ORAL_CAPSULE | Freq: Every day | ORAL | 0 refills | Status: DC

## 2021-05-27 NOTE — Telephone Encounter (Signed)
Insurance required 90 day prescriptions. 90 sent to pharmacy. Patient advised to keep up coming appointment.  ?

## 2021-05-28 ENCOUNTER — Encounter: Payer: Self-pay | Admitting: Obstetrics and Gynecology

## 2021-06-10 ENCOUNTER — Encounter: Payer: Self-pay | Admitting: Family Medicine

## 2021-06-10 ENCOUNTER — Ambulatory Visit: Payer: BC Managed Care – PPO | Admitting: Family Medicine

## 2021-06-10 VITALS — BP 110/76 | HR 84 | Resp 18 | Ht 61.0 in | Wt 189.0 lb

## 2021-06-10 DIAGNOSIS — F418 Other specified anxiety disorders: Secondary | ICD-10-CM

## 2021-06-10 DIAGNOSIS — G43009 Migraine without aura, not intractable, without status migrainosus: Secondary | ICD-10-CM | POA: Diagnosis not present

## 2021-06-10 DIAGNOSIS — Z1231 Encounter for screening mammogram for malignant neoplasm of breast: Secondary | ICD-10-CM

## 2021-06-10 MED ORDER — RIZATRIPTAN BENZOATE 10 MG PO TABS: 10.0000 mg | ORAL_TABLET | ORAL | 5 refills | Status: DC | PRN

## 2021-06-10 MED ORDER — VENLAFAXINE HCL ER 150 MG PO CP24: 150.0000 mg | ORAL_CAPSULE | Freq: Every day | ORAL | 1 refills | Status: DC

## 2021-06-10 NOTE — Assessment & Plan Note (Signed)
Doing well overall.  Not currently on prophylaxis. ?

## 2021-06-10 NOTE — Assessment & Plan Note (Signed)
Doing well on current dose of venlafaxine.  We will make sure she has refills sent to the pharmacy.  Follow-up in 9 months. ?

## 2021-06-10 NOTE — Progress Notes (Signed)
? ?Established Patient Office Visit ? ?Subjective   ?Patient ID: Tonya Phillips, female    DOB: 1970/05/10  Age: 51 y.o. MRN: [id number] ? ?Chief Complaint  ?Patient presents with  ? Depression  ?  Anxiety follow up. Patient on Venlafaxine and it is working well for rher.   ? ? ?HPI ? ?Follow-up depression with anxiety-she is currently on Effexor 150 mg.  She feels like she is doing really well on the medication she is happy with her dosing regimen.  In general she feels like her symptoms are well controlled. ? ?Follow-up migraine headaches-she says in the last 2 months she has had maybe 3 migraines but they also have not lasted as long which has been good.  Overall she feels like she is doing well sometimes spring allergies will trigger her migraines. ? ?She just underwent hysterectomy about 3 weeks ago.  She is doing well and recovering.  Still a little bit sore and has not been able to get back to the gym quite yet. ? ? ? ?ROS ? ?  ?Objective:  ?  ? ?BP 110/76   Pulse 84   Resp 18   Ht '5\' 1"'$  (1.549 m)   Wt 189 lb (85.7 kg)   LMP 10/20/2018   SpO2 95%   BMI 35.71 kg/m?  ? ? ?Physical Exam ?Vitals and nursing note reviewed.  ?Constitutional:   ?   Appearance: She is well-developed.  ?HENT:  ?   Head: Normocephalic and atraumatic.  ?Cardiovascular:  ?   Rate and Rhythm: Normal rate and regular rhythm.  ?   Heart sounds: Normal heart sounds.  ?Pulmonary:  ?   Effort: Pulmonary effort is normal.  ?   Breath sounds: Normal breath sounds.  ?Skin: ?   General: Skin is warm and dry.  ?Neurological:  ?   Mental Status: She is alert and oriented to person, place, and time.  ?Psychiatric:     ?   Behavior: Behavior normal.  ? ? ? ?No results found for any visits on 06/10/21. ? ? ? ?The ASCVD Risk score (Arnett DK, et al., 2019) failed to calculate for the following reasons: ?  Cannot find a previous HDL lab ? ?  ?Assessment & Plan:  ? ?Problem List Items Addressed This Visit   ? ?  ? Cardiovascular and Mediastinum  ?  Migraine without aura and without status migrainosus, not intractable - Primary  ?  Doing well overall.  Not currently on prophylaxis. ? ?  ?  ? Relevant Medications  ? venlafaxine XR (EFFEXOR-XR) 150 MG 24 hr capsule  ? rizatriptan (MAXALT) 10 MG tablet  ?  ? Other  ? Depression with anxiety  ?  Doing well on current dose of venlafaxine.  We will make sure she has refills sent to the pharmacy.  Follow-up in 9 months. ? ?  ?  ? Relevant Medications  ? venlafaxine XR (EFFEXOR-XR) 150 MG 24 hr capsule  ? ?Other Visit Diagnoses   ? ? Screening mammogram, encounter for      ? Relevant Orders  ? MM 3D SCREEN BREAST BILATERAL  ? ?  ? ?Encouraged her to schedule her mammogram at her convenience.  She did have her colonoscopy as well. ? ?Return in about 9 months (around 03/02/2022) for Migraine Headaches.  ? ?I spent 30 minutes on the day of the encounter to include pre-visit record review, face-to-face time with the patient and post visit ordering of test. ? ? ?Barnetta Chapel  Madilyn Fireman, MD ? ?

## 2021-06-18 NOTE — Progress Notes (Signed)
? ?GYNECOLOGY OFFICE VISIT NOTE ? ?History:  ? Tonya Phillips is a 51 y.o. G0P0000 here today for postop check s/p TLH, RSO, left salpingectomy, and cystoscopy.  ? ?Since surgery she has been doing well overall. Bleeding has stopped. Has still some pain in her lower abdomen and pelvic area but overall improving day by day. She has not yet returned to the gym with her trainer.   ? ? ?  ?Past Medical History:  ?Diagnosis Date  ? Anxiety   ? Arthritis   ? left knee  ? Depression   ? GERD (gastroesophageal reflux disease)   ? History of hiatal hernia   ? small  ? Hx of type 2 diabetes mellitus   ? prior to 2018  ? Migraines   ? NAFLD (nonalcoholic fatty liver disease) 2018  ? PCOS (polycystic ovarian syndrome)   ? Post-menopausal bleeding 01/2021  ? Right carpal tunnel syndrome   ? EMG scanned in  ? Sleep apnea   ? wears CPAP  ? Suicide attempt ([place]) 02/16/2009  ? Wears glasses   ? ? ?Past Surgical History:  ?Procedure Laterality Date  ? CHOLECYSTECTOMY    ? around 2010  ? COLONOSCOPY WITH ESOPHAGOGASTRODUODENOSCOPY (EGD)  03/28/2020  ? small hiatal hernia, tiny hyperplastic colon polyps  ? CYSTOSCOPY N/A 05/21/2021  ? Procedure: CYSTOSCOPY;  Surgeon: Radene Gunning, MD;  Location: [facility];  Service: Gynecology;  Laterality: N/A;  ? ENDOMETRIAL BIOPSY  03/20/2021  ? negative for hyperplasia & malignancy  ? LAPAROSCOPIC GASTRIC SLEEVE RESECTION  01/18/2017  ? NASAL SEPTUM SURGERY    ? around 2003 per pt  ? TOTAL LAPAROSCOPIC HYSTERECTOMY WITH SALPINGECTOMY N/A 05/21/2021  ? Procedure: TOTAL LAPAROSCOPIC HYSTERECTOMY WITH RIGHT SALPING-OOPHORECTOMY, LEFT SALPINGECTOMY;  Surgeon: Radene Gunning, MD;  Location: [place];  Service: Gynecology;  Laterality: N/A;  ? ? ?The following portions of the patient's history were reviewed and updated as appropriate: allergies, current medications, past family history, past medical history, past social history, past surgical history and problem list.   ? ?Health Maintenance:   ?Normal mammogram on 01/2018.  ? ?Review of Systems:  ?Pertinent items noted in HPI and remainder of comprehensive ROS otherwise negative. ? ?Physical Exam:  ?BP 114/74   Pulse 61   Resp 16   Ht '5\' 1"'$  (1.549 m)   Wt 189 lb (85.7 kg)   LMP 10/20/2018   BMI 35.71 kg/m?  ?CONSTITUTIONAL: Well-developed, well-nourished female in no acute distress.  ?HEENT:  Normocephalic, atraumatic. External right and left ear normal. No scleral icterus.  ?NECK: Normal range of motion, supple, no masses noted on observation ?SKIN: No rash noted. Not diaphoretic. No erythema. No pallor. ?MUSCULOSKELETAL: Normal range of motion. No edema noted. ?NEUROLOGIC: Alert and oriented to person, place, and time. Normal muscle tone coordination. No cranial nerve deficit noted. ?PSYCHIATRIC: Normal mood and affect. Normal behavior. Normal judgment and thought content. ? ?CARDIOVASCULAR: Normal heart rate noted ?RESPIRATORY: Effort and breath sounds normal, no problems with respiration noted ?ABDOMEN: No masses noted. No other overt distention noted.  Incision c/d/I x 4 ? ?PELVIC:  cuff healing well visually and by digital inspection ? ?Labs and Imaging ?No results found for this or any previous visit (from the past 168 hour(s)). ?No results found.  ?Assessment and Plan:  ? 1. Postop check ?All restrictions lifted except sexual activity and heavy lifting.  ?Works stationary for 7 hr straight - she may need 2 more weeks from work - reasonable  if needed for her recovery.  ?May do hydrogen peroxide daily in umbilicus ? ?2. H/O: hysterectomy ? ? ?Routine preventative health maintenance measures emphasized. ?Please refer to After Visit Summary for other counseling recommendations.  ? ?No follow-ups on file. ? ?Radene Gunning, MD, FACOG ?Obstetrician Social research officer, government, Faculty Practice ?[facility] ? ? ? ? ? ?

## 2021-06-19 ENCOUNTER — Ambulatory Visit (INDEPENDENT_AMBULATORY_CARE_PROVIDER_SITE_OTHER): Payer: BC Managed Care – PPO | Admitting: Obstetrics and Gynecology

## 2021-06-19 ENCOUNTER — Encounter: Payer: Self-pay | Admitting: Obstetrics and Gynecology

## 2021-06-19 VITALS — BP 114/74 | HR 61 | Resp 16 | Ht 61.0 in | Wt 189.0 lb

## 2021-06-19 DIAGNOSIS — Z90710 Acquired absence of both cervix and uterus: Secondary | ICD-10-CM

## 2021-06-19 DIAGNOSIS — Z09 Encounter for follow-up examination after completed treatment for conditions other than malignant neoplasm: Secondary | ICD-10-CM

## 2021-06-24 ENCOUNTER — Encounter: Payer: Self-pay | Admitting: Obstetrics and Gynecology

## 2021-07-21 DIAGNOSIS — K219 Gastro-esophageal reflux disease without esophagitis: Secondary | ICD-10-CM | POA: Diagnosis not present

## 2021-07-23 ENCOUNTER — Telehealth: Payer: Self-pay | Admitting: Emergency Medicine

## 2021-07-23 NOTE — Telephone Encounter (Signed)
RC to patient regarding leave paperwork for her employer. LVM.

## 2021-07-29 ENCOUNTER — Telehealth: Payer: Self-pay | Admitting: *Deleted

## 2021-07-29 NOTE — Telephone Encounter (Signed)
Returned TC to pt regarding needs for disability forms. Pt reports disability claim was denied for the 2 additional weeks following her post op visit. She reports that the office note from Dr. Damita Dunnings does not specify what the medical reasons for extending disability were. The pt is requesting a letter stating those reasons, on office letter head with Dr. Josefine Class signature. Dr. Damita Dunnings out of office until 08/04/21. Advised pt that we could f/u with her then. Message forwarded to office manager.

## 2021-07-30 ENCOUNTER — Encounter: Payer: Self-pay | Admitting: *Deleted

## 2021-10-29 IMAGING — CT CT ABD-PELV W/O CM
2 of 4 series · 16 of 46 positions shown, 18 images · non-contrast
Comparison: None.

CLINICAL DATA: Abdominal pain

EXAM:
CT ABDOMEN AND PELVIS WITHOUT CONTRAST
TECHNIQUE: Multidetector CT imaging of the abdomen and pelvis was performed
following the standard protocol without IV contrast.

[Series 2: axial st · axial · 0.85mm/px · z∈[+745,+1200]mm · 13 of 101 slices shown, 15 images]
[im 5/101  soft-tissue]
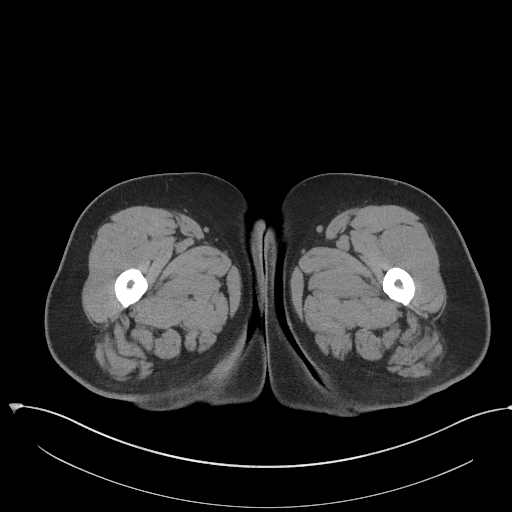
[im 5/101  bone]
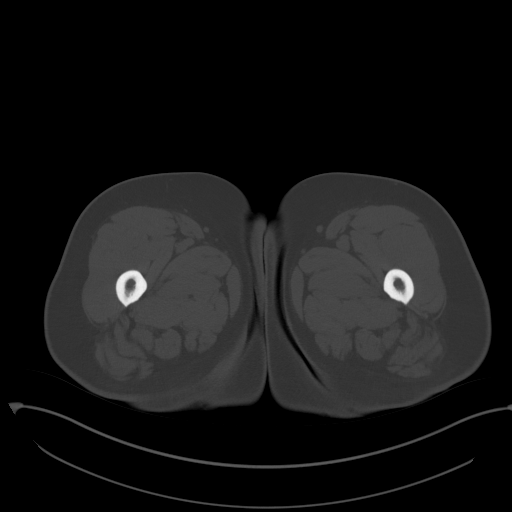
[im 14/101  soft-tissue]
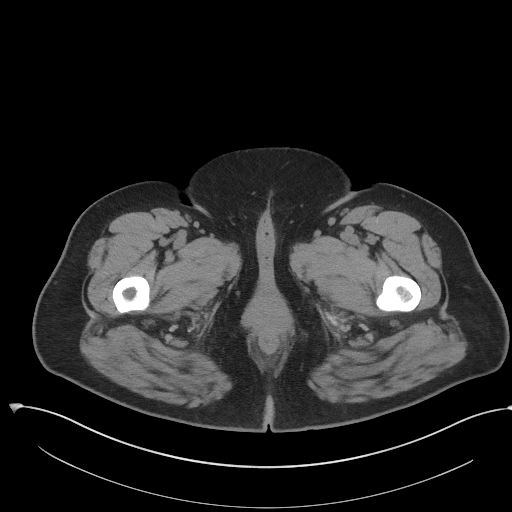
[im 22/101  soft-tissue]
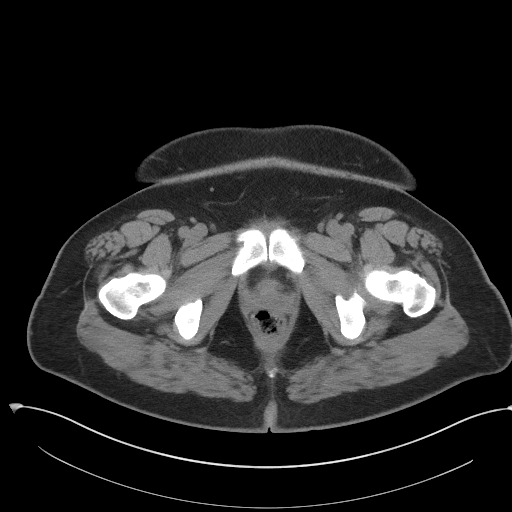
[im 27/101  soft-tissue]
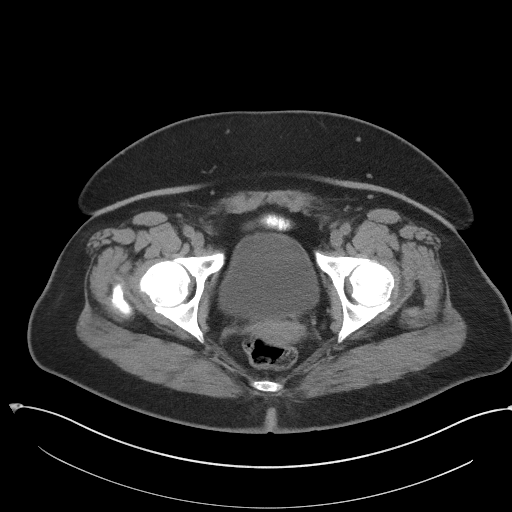
[im 35/101  soft-tissue]
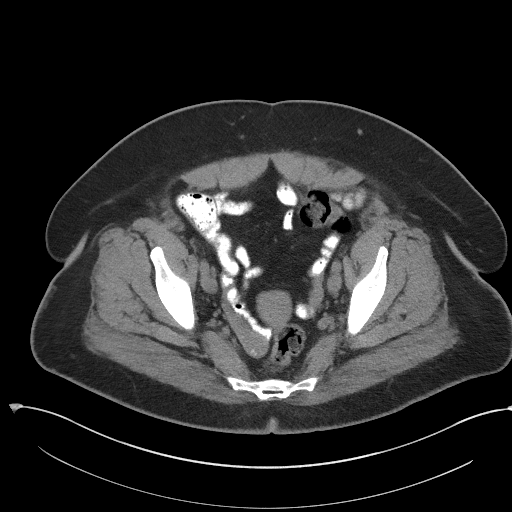
[im 44/101  soft-tissue]
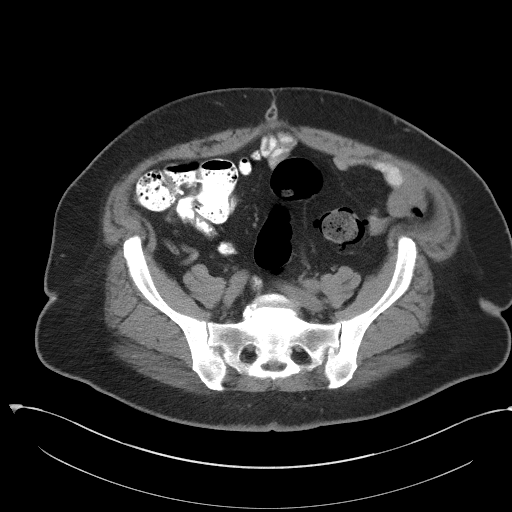
[im 53/101  soft-tissue]
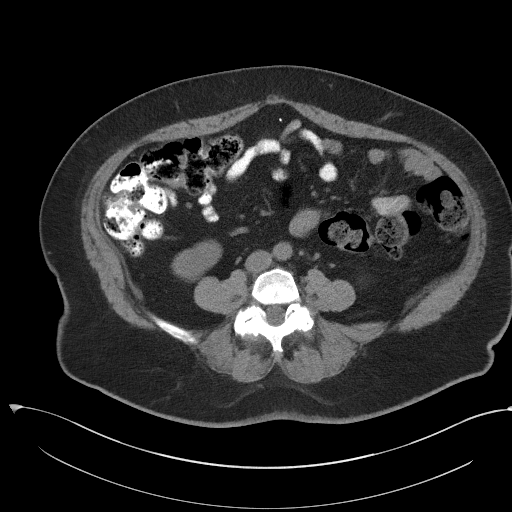
[im 57/101  soft-tissue]
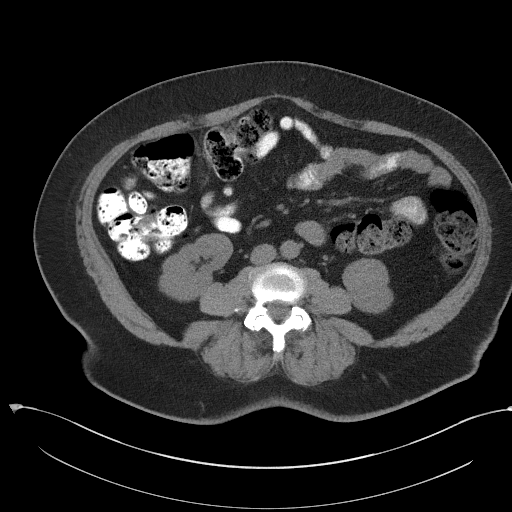
[im 66/101  soft-tissue]
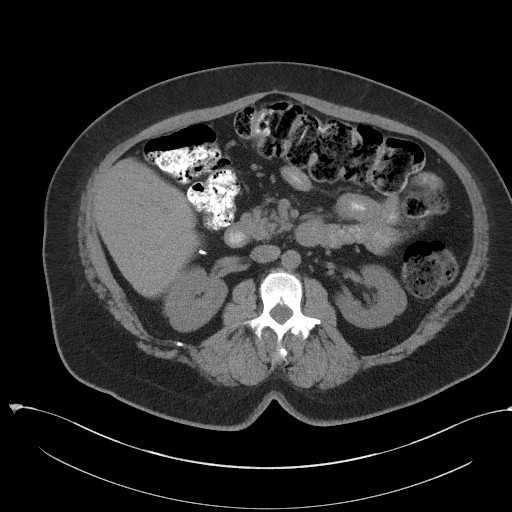
[im 66/101  bone]
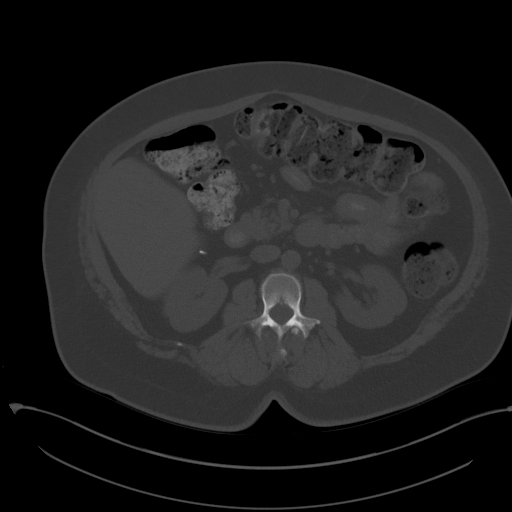
[im 74/101  soft-tissue]
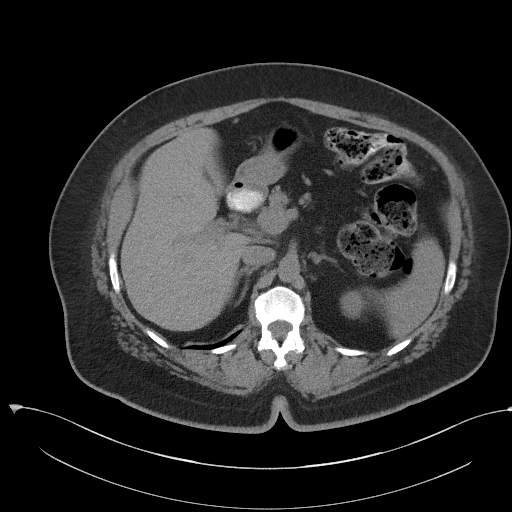
[im 79/101  soft-tissue]
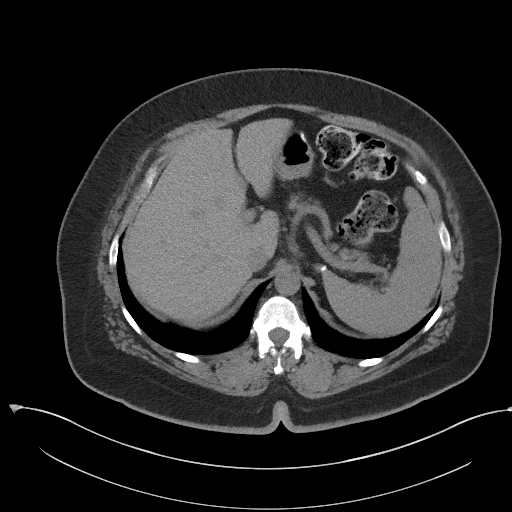
[im 87/101  soft-tissue]
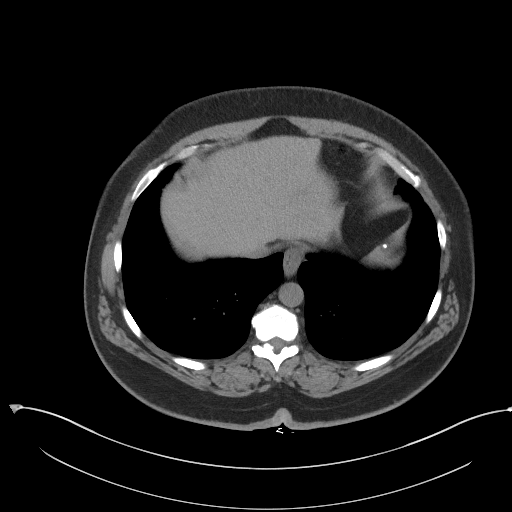
[im 96/101  soft-tissue]
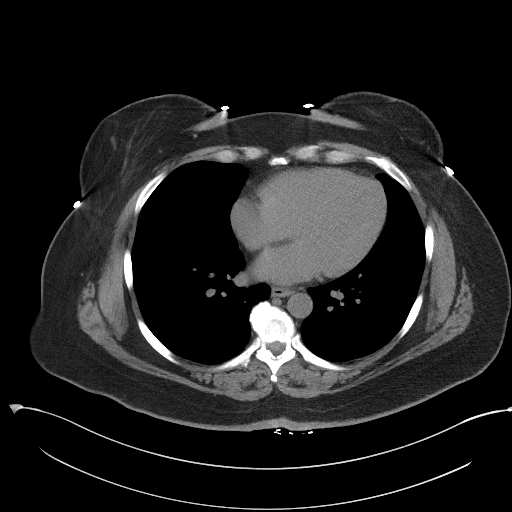

[Series 5: coronal st · coronal · 0.75mm/px · 3 of 107 slices shown]
[im 36/107  soft-tissue]
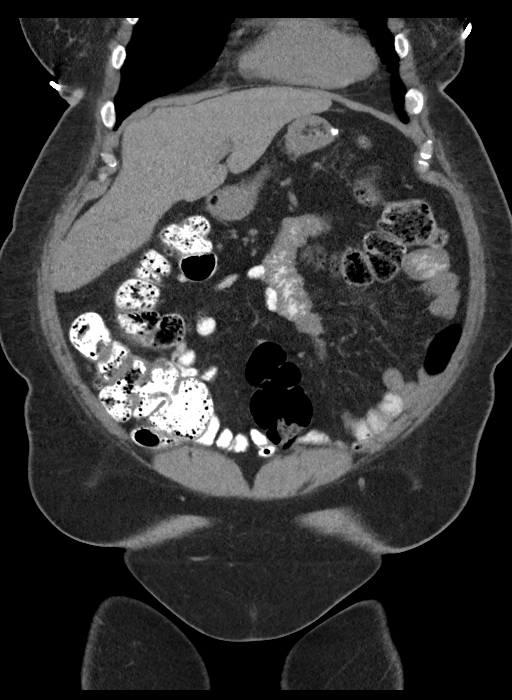
[im 48/107  soft-tissue]
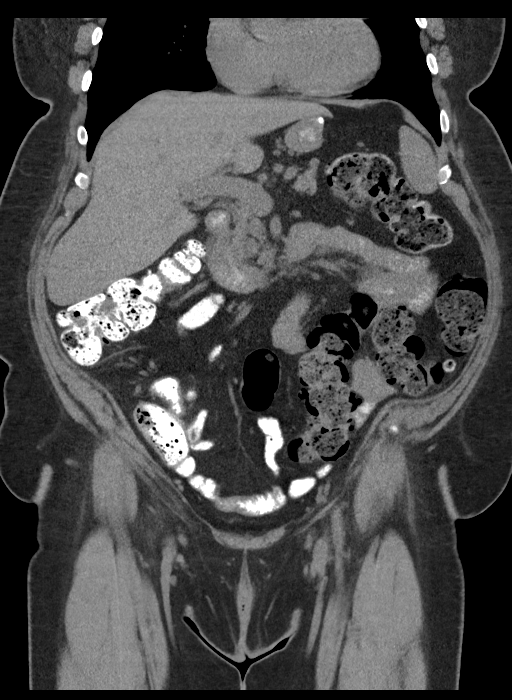
[im 59/107  soft-tissue]
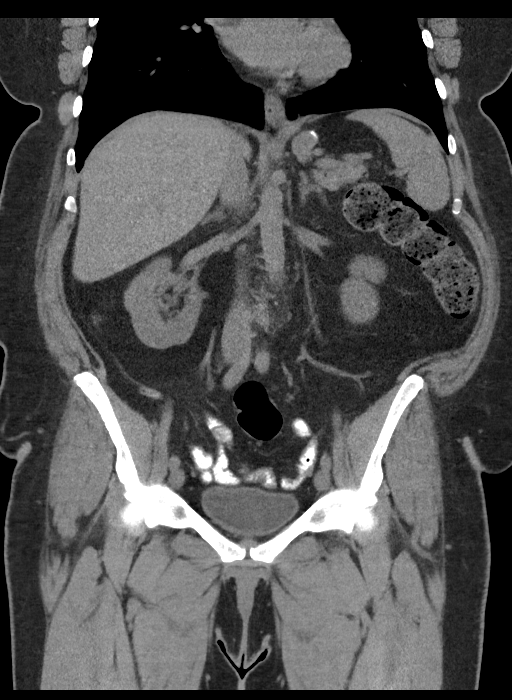

[16 of 46 positions shown; findings below may reference images not displayed]

FINDINGS: Lower chest: Lung bases are clear.

Hepatobiliary: Unenhanced liver is unremarkable.

Status post cholecystectomy. No intrahepatic or extrahepatic ductal
dilatation.

Pancreas: Within normal limits.

Spleen: Within normal limits.

Adrenals/Urinary Tract: Adrenal glands are within normal limits.

Kidneys are within normal limits. No renal calculi or
hydronephrosis.

Bladder is within normal limits.

Stomach/Bowel: Postsurgical changes related to suspected sleeve
gastrectomy.

No evidence of bowel obstruction.

Normal appendix (series 2/image 56).

Vascular/Lymphatic: No evidence of abdominal aortic aneurysm.

No suspicious abdominopelvic lymphadenopathy.

Reproductive: Uterus is within normal limits.

Bilateral ovaries are grossly unremarkable.

Other: No abdominopelvic ascites.

No evidence of ventral or inguinal hernia.

Musculoskeletal: Grade 1 spondylolisthesis at L5-S1.

Degenerative changes of the visualized thoracolumbar spine.
IMPRESSION: No evidence of ventral or inguinal hernia.

Status post sleeve gastrectomy and cholecystectomy.

No evidence of bowel obstruction.  Normal appendix.

## 2022-01-20 ENCOUNTER — Encounter: Payer: Self-pay | Admitting: Family Medicine

## 2022-01-21 NOTE — Telephone Encounter (Signed)
Form completed and placed in [place] [facility] basket.

## 2022-01-28 ENCOUNTER — Encounter: Payer: Self-pay | Admitting: Family Medicine

## 2022-02-03 ENCOUNTER — Ambulatory Visit (INDEPENDENT_AMBULATORY_CARE_PROVIDER_SITE_OTHER): Payer: BC Managed Care – PPO | Admitting: Family Medicine

## 2022-02-03 ENCOUNTER — Encounter: Payer: Self-pay | Admitting: Family Medicine

## 2022-02-03 VITALS — BP 121/69 | HR 74 | Ht 61.0 in | Wt 177.0 lb

## 2022-02-03 DIAGNOSIS — E669 Obesity, unspecified: Secondary | ICD-10-CM

## 2022-02-03 DIAGNOSIS — F418 Other specified anxiety disorders: Secondary | ICD-10-CM | POA: Diagnosis not present

## 2022-02-03 DIAGNOSIS — G43009 Migraine without aura, not intractable, without status migrainosus: Secondary | ICD-10-CM | POA: Diagnosis not present

## 2022-02-03 MED ORDER — BUSPIRONE HCL 7.5 MG PO TABS: 7.5000 mg | ORAL_TABLET | Freq: Two times a day (BID) | ORAL | 1 refills | Status: DC

## 2022-02-03 MED ORDER — RIZATRIPTAN BENZOATE 10 MG PO TABS: 10.0000 mg | ORAL_TABLET | ORAL | 5 refills | Status: AC | PRN

## 2022-02-03 NOTE — Assessment & Plan Note (Signed)
We discussed possible prescription options.  It sounds like she has made some good changes.  Encouraged her to check with her insurance on coverage.

## 2022-02-03 NOTE — Assessment & Plan Note (Signed)
Discussed options.  She is actually done really well on her venlafaxine for quite some time.  We discussed possibly adding something to it at least short-term such as buspirone or maybe even Wellbutrin.  Will start with buspirone and see if this is helpful.  Follow-up in 1 month.  Continue to work on strategies to decompress throughout her day.  She has been exercising regularly which is sinful helpful.  She also looks forward to doing that in the evenings.

## 2022-02-03 NOTE — Assessment & Plan Note (Signed)
Will refill Maxalt.

## 2022-02-03 NOTE — Progress Notes (Signed)
Established Patient Office Visit  Subjective   Patient ID: SICLALY HANISCH, female    DOB: 31-May-1970  Age: 51 y.o. MRN: [id number]  Chief Complaint  Patient presents with   Follow-up    HPI  Follow-up depression/anxiety-she is doing okay in general but recently had to go back into the office to work and had not been in the office in almost 4 years.  It has been very stressful and overstimulating.  Everyone's new and that has been difficult.  She feels like she is getting distracted and overwhelmed easily.  She has become more tearful in general.  Also wanted to discuss weight management.  She has been working hard to lose about 30 pounds this year.  But feels like she has plateaued and wanted to ask about weight loss medications as an option.  She has recently started power lifting and is hoping to be good enough to do competitions in 2024.  Sleep is fair.  Migraines are doing okay but she does need refills on her medication.  Uses Maxalt as needed.    ROS    Objective:     BP 121/69   Pulse 74   Ht 5\' 1"  (1.549 m)   Wt 177 lb (80.3 kg)   LMP 10/20/2018   SpO2 99%   BMI 33.44 kg/m    Physical Exam Vitals and nursing note reviewed.  Constitutional:      Appearance: She is well-developed.  HENT:     Head: Normocephalic and atraumatic.  Cardiovascular:     Rate and Rhythm: Normal rate and regular rhythm.     Heart sounds: Normal heart sounds.  Pulmonary:     Effort: Pulmonary effort is normal.     Breath sounds: Normal breath sounds.  Skin:    General: Skin is warm and dry.  Neurological:     Mental Status: She is alert and oriented to person, place, and time.  Psychiatric:        Behavior: Behavior normal.     No results found for any visits on 02/03/22.    The ASCVD Risk score (Arnett DK, et al., 2019) failed to calculate for the following reasons:   Cannot find a previous HDL lab    Assessment & Plan:   Problem List Items Addressed This Visit        Cardiovascular and Mediastinum   Migraine without aura and without status migrainosus, not intractable    Well-controlled.  Will refill Maxalt.      Relevant Medications   rizatriptan (MAXALT) 10 MG tablet     Other   Obesity, Class II, BMI 35-39.9    We discussed possible prescription options.  It sounds like she has made some good changes.  Encouraged her to check with her insurance on coverage.      Depression with anxiety - Primary    Discussed options.  She is actually done really well on her venlafaxine for quite some time.  We discussed possibly adding something to it at least short-term such as buspirone or maybe even Wellbutrin.  Will start with buspirone and see if this is helpful.  Follow-up in 1 month.  Continue to work on strategies to decompress throughout her day.  She has been exercising regularly which is sinful helpful.  She also looks forward to doing that in the evenings.       Relevant Medications   busPIRone (BUSPAR) 7.5 MG tablet    Return in about 1 month (around 03/06/2022)  for New start medication.    Nani Gasser, MD

## 2022-02-16 ENCOUNTER — Other Ambulatory Visit: Payer: Self-pay | Admitting: Family Medicine

## 2022-02-16 DIAGNOSIS — F418 Other specified anxiety disorders: Secondary | ICD-10-CM

## 2022-02-17 DIAGNOSIS — R1013 Epigastric pain: Secondary | ICD-10-CM | POA: Diagnosis not present

## 2022-02-17 DIAGNOSIS — K219 Gastro-esophageal reflux disease without esophagitis: Secondary | ICD-10-CM | POA: Diagnosis not present

## 2022-02-17 DIAGNOSIS — R1310 Dysphagia, unspecified: Secondary | ICD-10-CM | POA: Diagnosis not present

## 2022-02-19 ENCOUNTER — Ambulatory Visit: Payer: BC Managed Care – PPO | Admitting: Family Medicine

## 2022-02-19 DIAGNOSIS — R1013 Epigastric pain: Secondary | ICD-10-CM | POA: Diagnosis not present

## 2022-02-19 DIAGNOSIS — R1310 Dysphagia, unspecified: Secondary | ICD-10-CM | POA: Diagnosis not present

## 2022-02-19 DIAGNOSIS — K219 Gastro-esophageal reflux disease without esophagitis: Secondary | ICD-10-CM | POA: Diagnosis not present

## 2022-02-19 DIAGNOSIS — K2289 Other specified disease of esophagus: Secondary | ICD-10-CM | POA: Diagnosis not present

## 2022-02-26 ENCOUNTER — Other Ambulatory Visit: Payer: Self-pay | Admitting: Family Medicine

## 2022-02-26 DIAGNOSIS — F418 Other specified anxiety disorders: Secondary | ICD-10-CM

## 2022-03-09 DIAGNOSIS — K912 Postsurgical malabsorption, not elsewhere classified: Secondary | ICD-10-CM | POA: Diagnosis not present

## 2022-03-09 DIAGNOSIS — Z903 Acquired absence of stomach [part of]: Secondary | ICD-10-CM | POA: Insufficient documentation

## 2022-03-09 DIAGNOSIS — K219 Gastro-esophageal reflux disease without esophagitis: Secondary | ICD-10-CM | POA: Diagnosis not present

## 2022-03-09 DIAGNOSIS — E785 Hyperlipidemia, unspecified: Secondary | ICD-10-CM | POA: Diagnosis not present

## 2022-03-09 DIAGNOSIS — K760 Fatty (change of) liver, not elsewhere classified: Secondary | ICD-10-CM | POA: Diagnosis not present

## 2022-03-10 ENCOUNTER — Ambulatory Visit: Payer: BC Managed Care – PPO | Admitting: Family Medicine

## 2022-03-10 ENCOUNTER — Encounter: Payer: Self-pay | Admitting: Family Medicine

## 2022-03-10 VITALS — BP 113/65 | HR 69 | Ht 61.0 in | Wt 172.0 lb

## 2022-03-10 DIAGNOSIS — F418 Other specified anxiety disorders: Secondary | ICD-10-CM | POA: Diagnosis not present

## 2022-03-10 NOTE — Assessment & Plan Note (Addendum)
PHQ-9 score 4 GAD-7 score of 5.  Pretty stable from previous scores.  She would like to give the buspirone at least 1 more month to see if she feels like it is helpful and if it not at that point then we could consider adding Wellbutrin instead.  She also applied for a new position and is hopeful she will get that.  She will send me a MyChart or let me know if she wants to continue with the medication or switch at the end of the month.  If we do end up switching medications I would like to see her back about 4 to 6 weeks after starting the new 1.  Continue with venlafaxine for now.  Urged her to consider therapy/counseling and to reach out if it is something she is interested in.

## 2022-03-10 NOTE — Progress Notes (Signed)
   Established Patient Office Visit  Subjective   Patient ID: Tonya Phillips, female    DOB: [birth year]  Age: 52 y.o. MRN: [id number]  Chief Complaint  Patient presents with   Follow-up         HPI  Follow-up depression with anxiety-currently on venlafaxine.  When I saw her about 4 weeks ago we decided to add buspirone to see if this would help she was dealing with some acute stressors at work in particular.  A family friend passed away suddenly and that was pretty stressful.  She has been exercising regularly which is great.  She has a great support group at church and at the gym.  Boss at work has been a little bit more kind to her which has been helpful but she is applying for a new position and she is hopeful to get it she should find out probably another 2 to 3 weeks.    ROS    Objective:     BP 113/65   Pulse 69   Ht '5\' 1"'$  (1.549 m)   Wt 172 lb (78 kg)   LMP 10/20/2018   SpO2 99%   BMI 32.50 kg/m    Physical Exam Vitals and nursing note reviewed.  Constitutional:      Appearance: She is well-developed.  HENT:     Head: Normocephalic and atraumatic.  Cardiovascular:     Rate and Rhythm: Normal rate and regular rhythm.     Heart sounds: Normal heart sounds.  Pulmonary:     Effort: Pulmonary effort is normal.     Breath sounds: Normal breath sounds.  Skin:    General: Skin is warm and dry.  Neurological:     Mental Status: She is alert and oriented to person, place, and time.  Psychiatric:        Behavior: Behavior normal.      No results found for any visits on 03/10/22.    The ASCVD Risk score (Arnett DK, et al., 2019) failed to calculate for the following reasons:   Cannot find a previous HDL lab    Assessment & Plan:   Problem List Items Addressed This Visit       Other   Depression with anxiety - Primary    PHQ-9 score 4 GAD-7 score of 5.  Pretty stable from previous scores.  She would like to give the buspirone at least 1 more month to  see if she feels like it is helpful and if it not at that point then we could consider adding Wellbutrin instead.  She also applied for a new position and is hopeful she will get that.  She will send me a MyChart or let me know if she wants to continue with the medication or switch at the end of the month.  If we do end up switching medications I would like to see her back about 4 to 6 weeks after starting the new 1.  Continue with venlafaxine for now.  Urged her to consider therapy/counseling and to reach out if it is something she is interested in.       Encouraged her to schedule her mammogram when she can.  Return in about 10 weeks (around 05/19/2022).    Beatrice Lecher, MD

## 2022-03-11 DIAGNOSIS — R1013 Epigastric pain: Secondary | ICD-10-CM | POA: Diagnosis not present

## 2022-03-11 DIAGNOSIS — K219 Gastro-esophageal reflux disease without esophagitis: Secondary | ICD-10-CM | POA: Diagnosis not present

## 2022-03-12 ENCOUNTER — Encounter: Payer: Self-pay | Admitting: Family Medicine

## 2022-03-12 NOTE — Telephone Encounter (Signed)
Mychart

## 2022-05-19 ENCOUNTER — Other Ambulatory Visit: Payer: Self-pay | Admitting: Family Medicine

## 2022-05-19 DIAGNOSIS — F418 Other specified anxiety disorders: Secondary | ICD-10-CM

## 2022-05-21 ENCOUNTER — Ambulatory Visit: Payer: BC Managed Care – PPO | Admitting: Family Medicine

## 2022-06-24 ENCOUNTER — Encounter: Payer: Self-pay | Admitting: Family Medicine

## 2022-06-24 ENCOUNTER — Other Ambulatory Visit: Payer: BC Managed Care – PPO | Admitting: Family Medicine

## 2022-06-24 ENCOUNTER — Ambulatory Visit: Payer: BC Managed Care – PPO | Admitting: Family Medicine

## 2022-06-24 VITALS — BP 123/75 | HR 70 | Resp 20 | Ht 61.0 in | Wt 183.1 lb

## 2022-06-24 DIAGNOSIS — J0100 Acute maxillary sinusitis, unspecified: Secondary | ICD-10-CM | POA: Insufficient documentation

## 2022-06-24 DIAGNOSIS — R051 Acute cough: Secondary | ICD-10-CM

## 2022-06-24 MED ORDER — HYDROCOD POLI-CHLORPHE POLI ER 10-8 MG/5ML PO SUER: 5.0000 mL | Freq: Two times a day (BID) | ORAL | 0 refills | Status: DC | PRN

## 2022-06-24 MED ORDER — AMOXICILLIN-POT CLAVULANATE 875-125 MG PO TABS: 1.0000 | ORAL_TABLET | Freq: Two times a day (BID) | ORAL | 0 refills | Status: AC

## 2022-06-24 MED ORDER — ALBUTEROL SULFATE HFA 108 (90 BASE) MCG/ACT IN AERS: 2.0000 | INHALATION_SPRAY | Freq: Four times a day (QID) | RESPIRATORY_TRACT | 0 refills | Status: DC | PRN

## 2022-06-24 NOTE — Assessment & Plan Note (Signed)
Due to patient's symptoms and duration will go ahead and give augmentin for 5 days for bacterial sinusitis  - follow up in one week

## 2022-06-24 NOTE — Assessment & Plan Note (Signed)
-   pt notes cough. Have sent in cough medication and inhaler

## 2022-06-24 NOTE — Progress Notes (Signed)
Established patient visit   Patient: Tonya Phillips   DOB: December 28, 1970   52 y.o. Female  MRN: [id number] Visit Date: 06/24/2022  Today's healthcare provider: Charlton Amor, DO   Chief Complaint  Patient presents with   Chest Pain   SINUS PRESSURE    SUBJECTIVE    Chief Complaint  Patient presents with   Chest Pain   SINUS PRESSURE   HPI  Pt presents with sinus pressure and congestion.  Started 6 days ago.  Patient says she has a history of seasonal allergies.  She also has developed a cough.  She denies any fever, chills.  Review of Systems  Constitutional:  Negative for activity change, fatigue and fever.  HENT:  Positive for sinus pressure and sinus pain.   Respiratory:  Negative for cough and shortness of breath.   Cardiovascular:  Negative for chest pain.  Gastrointestinal:  Negative for abdominal pain.  Genitourinary:  Negative for difficulty urinating.       Current Meds  Medication Sig   acetaminophen (TYLENOL) 500 MG tablet Take 2 tablets (1,000 mg total) by mouth every 8 (eight) hours as needed.   albuterol (VENTOLIN HFA) 108 (90 Base) MCG/ACT inhaler Inhale 2 puffs into the lungs every 6 (six) hours as needed for wheezing or shortness of breath.   AMBULATORY NON FORMULARY MEDICATION Medication Name: CPAP, humidifier and supplies set to 12 cm water pressure. . Dx OSA. [employer]oc[employer].   amoxicillin-clavulanate (AUGMENTIN) 875-125 MG tablet Take 1 tablet by mouth 2 (two) times daily for 5 days.   Biotin 2500 MCG CAPS Take by mouth.   busPIRone (BUSPAR) 7.5 MG tablet TAKE 1 TABLET BY MOUTH 2 TIMES DAILY.   Calcium Citrate-Vitamin D (RA CALCIUM CITRATE PLUS VIT D PO) Take by mouth.   chlorpheniramine-HYDROcodone (TUSSIONEX) 10-8 MG/5ML Take 5 mLs by mouth every 12 (twelve) hours as needed for cough (cough, will cause drowsiness.).   fluticasone (FLONASE) 50 MCG/ACT nasal spray Place into both nostrils daily.   glucosamine-chondroitin 500-400 MG tablet Take 1 tablet by  mouth 3 (three) times daily.   ibuprofen (ADVIL) 800 MG tablet Take 1 tablet (800 mg total) by mouth 3 (three) times daily with meals as needed for headache or moderate pain.   IRON-VITAMIN C PO Take by mouth.   meloxicam (MOBIC) 15 MG tablet Take 15 mg by mouth as needed.   promethazine (PHENERGAN) 25 MG tablet Take 1 tablet (25 mg total) by mouth every 6 (six) hours as needed for nausea or vomiting.   RABEprazole (ACIPHEX) 20 MG tablet Take 20 mg by mouth daily.   rizatriptan (MAXALT) 10 MG tablet Take 1 tablet (10 mg total) by mouth as needed for migraine. APPT FOR REFILLS   venlafaxine XR (EFFEXOR-XR) 150 MG 24 hr capsule TAKE 1 CAPSULE BY MOUTH DAILY WITH BREAKFAST.   vitamin C (ASCORBIC ACID) 250 MG tablet Take 250 mg by mouth daily.    OBJECTIVE    BP 123/75 (BP Location: Right Arm, Cuff Size: Normal)   Pulse 70   Resp 20   Ht 5\' 1"  (1.549 m)   Wt 183 lb 1.9 oz (83.1 kg)   LMP 10/20/2018   SpO2 96%   BMI 34.60 kg/m   Physical Exam Vitals and nursing note reviewed.  Constitutional:      General: She is not in acute distress.    Appearance: Normal appearance.  HENT:     Head: Normocephalic and atraumatic.     Comments:  Tenderness to palpation of maxillary and frontal sinuses bilaterally    Right Ear: External ear normal.     Left Ear: External ear normal.     Nose: Nose normal.  Eyes:     Conjunctiva/sclera: Conjunctivae normal.  Cardiovascular:     Rate and Rhythm: Normal rate and regular rhythm.  Pulmonary:     Effort: Pulmonary effort is normal.     Breath sounds: Normal breath sounds.  Neurological:     General: No focal deficit present.     Mental Status: She is alert and oriented to person, place, and time.  Psychiatric:        Mood and Affect: Mood normal.        Behavior: Behavior normal.        Thought Content: Thought content normal.        Judgment: Judgment normal.          ASSESSMENT & PLAN    Problem List Items Addressed This Visit        Respiratory   Acute non-recurrent maxillary sinusitis - Primary    Due to patient's symptoms and duration will go ahead and give augmentin for 5 days for bacterial sinusitis  - follow up in one week       Relevant Medications   amoxicillin-clavulanate (AUGMENTIN) 875-125 MG tablet   chlorpheniramine-HYDROcodone (TUSSIONEX) 10-8 MG/5ML     Other   Acute cough    - pt notes cough. Have sent in cough medication and inhaler      Relevant Medications   chlorpheniramine-HYDROcodone (TUSSIONEX) 10-8 MG/5ML   albuterol (VENTOLIN HFA) 108 (90 Base) MCG/ACT inhaler    Return in about 1 week (around 07/01/2022).      Meds ordered this encounter  Medications   amoxicillin-clavulanate (AUGMENTIN) 875-125 MG tablet    Sig: Take 1 tablet by mouth 2 (two) times daily for 5 days.    Dispense:  10 tablet    Refill:  0   chlorpheniramine-HYDROcodone (TUSSIONEX) 10-8 MG/5ML    Sig: Take 5 mLs by mouth every 12 (twelve) hours as needed for cough (cough, will cause drowsiness.).    Dispense:  120 mL    Refill:  0   albuterol (VENTOLIN HFA) 108 (90 Base) MCG/ACT inhaler    Sig: Inhale 2 puffs into the lungs every 6 (six) hours as needed for wheezing or shortness of breath.    Dispense:  8 g    Refill:  0    No orders of the defined types were placed in this encounter.    Charlton Amor, DO  [facility] at [facility] [phone] (phone) [phone] (fax)  [facility]

## 2022-06-26 ENCOUNTER — Encounter: Payer: Self-pay | Admitting: Family Medicine

## 2022-06-26 DIAGNOSIS — R051 Acute cough: Secondary | ICD-10-CM

## 2022-06-30 NOTE — Telephone Encounter (Signed)
Prior Berkley Harvey is required. Thanks in advance.

## 2022-07-01 ENCOUNTER — Ambulatory Visit: Payer: BC Managed Care – PPO | Admitting: Family Medicine

## 2022-07-01 ENCOUNTER — Encounter: Payer: Self-pay | Admitting: Family Medicine

## 2022-07-01 VITALS — BP 119/76 | HR 74 | Ht 61.0 in | Wt 188.0 lb

## 2022-07-01 DIAGNOSIS — J0181 Other acute recurrent sinusitis: Secondary | ICD-10-CM | POA: Insufficient documentation

## 2022-07-01 DIAGNOSIS — R051 Acute cough: Secondary | ICD-10-CM | POA: Diagnosis not present

## 2022-07-01 MED ORDER — FULL KIT NEBULIZER SET MISC
0 refills | Status: DC
Start: 2022-07-01 — End: 2022-08-21

## 2022-07-01 MED ORDER — ALBUTEROL SULFATE (2.5 MG/3ML) 0.083% IN NEBU: 2.5000 mg | INHALATION_SOLUTION | Freq: Four times a day (QID) | RESPIRATORY_TRACT | 1 refills | Status: DC | PRN

## 2022-07-01 MED ORDER — ALBUTEROL SULFATE HFA 108 (90 BASE) MCG/ACT IN AERS: 2.0000 | INHALATION_SPRAY | Freq: Four times a day (QID) | RESPIRATORY_TRACT | 0 refills | Status: DC | PRN

## 2022-07-01 MED ORDER — DOXYCYCLINE HYCLATE 100 MG PO TABS: 100.0000 mg | ORAL_TABLET | Freq: Two times a day (BID) | ORAL | 0 refills | Status: AC

## 2022-07-01 MED ORDER — BENZONATATE 100 MG PO CAPS
100.0000 mg | ORAL_CAPSULE | Freq: Three times a day (TID) | ORAL | 0 refills | Status: AC | PRN
Start: 2022-07-01 — End: 2022-07-11

## 2022-07-01 NOTE — Assessment & Plan Note (Signed)
Patient still having signs and symptoms of sinusitis.  Have gone ahead and started patient on doxycycline.  Follow-up next week to assess efficacy.

## 2022-07-01 NOTE — Assessment & Plan Note (Addendum)
Patient had acute cough in clinic today have gone ahead and given nebulizer treatment.  Have also ordered nebulizer kit and solution for patient. Lung sounds improvement post nebulizer treatment - Have given Tessalon Perles as well as an albuterol inhaler

## 2022-07-01 NOTE — Progress Notes (Signed)
Established patient visit   Patient: Tonya Phillips   DOB: 1970/05/03   52 y.o. Female  MRN: [id number] Visit Date: 07/01/2022  Today's healthcare provider: Charlton Amor, DO   Chief Complaint  Patient presents with   Follow-up    SUBJECTIVE    Chief Complaint  Patient presents with   Follow-up   HPI   Pt presents for follow up on acute bacterial sinusitis. She was started on augmentin and given a prescription of tussinex for coughing as well. She continued to have a cough a few days later and a cxr was ordered. Per epic review, it does not look like cxr has been done yet.     Review of Systems  Constitutional:  Negative for activity change, fatigue and fever.  Respiratory:  Negative for cough and shortness of breath.   Cardiovascular:  Negative for chest pain.  Gastrointestinal:  Negative for abdominal pain.  Genitourinary:  Negative for difficulty urinating.       Current Meds  Medication Sig   acetaminophen (TYLENOL) 500 MG tablet Take 2 tablets (1,000 mg total) by mouth every 8 (eight) hours as needed.   albuterol (PROVENTIL) (2.5 MG/3ML) 0.083% nebulizer solution Take 3 mLs (2.5 mg total) by nebulization every 6 (six) hours as needed for wheezing or shortness of breath.   albuterol (VENTOLIN HFA) 108 (90 Base) MCG/ACT inhaler Inhale 2 puffs into the lungs every 6 (six) hours as needed for wheezing or shortness of breath.   albuterol (VENTOLIN HFA) 108 (90 Base) MCG/ACT inhaler Inhale 2 puffs into the lungs every 6 (six) hours as needed for wheezing or shortness of breath.   AMBULATORY NON FORMULARY MEDICATION Medication Name: CPAP, humidifier and supplies set to 12 cm water pressure. . Dx OSA. [employer].   benzonatate (TESSALON) 100 MG capsule Take 1 capsule (100 mg total) by mouth 3 (three) times daily as needed for up to 10 days for cough.   Biotin 2500 MCG CAPS Take by mouth.   busPIRone (BUSPAR) 7.5 MG tablet TAKE 1 TABLET BY MOUTH 2 TIMES DAILY.   Calcium  Citrate-Vitamin D (RA CALCIUM CITRATE PLUS VIT D PO) Take by mouth.   chlorpheniramine-HYDROcodone (TUSSIONEX) 10-8 MG/5ML Take 5 mLs by mouth every 12 (twelve) hours as needed for cough (cough, will cause drowsiness.).   doxycycline (VIBRA-TABS) 100 MG tablet Take 1 tablet (100 mg total) by mouth 2 (two) times daily for 7 days.   fluticasone (FLONASE) 50 MCG/ACT nasal spray Place into both nostrils daily.   glucosamine-chondroitin 500-400 MG tablet Take 1 tablet by mouth 3 (three) times daily.   ibuprofen (ADVIL) 800 MG tablet Take 1 tablet (800 mg total) by mouth 3 (three) times daily with meals as needed for headache or moderate pain.   IRON-VITAMIN C PO Take by mouth.   meloxicam (MOBIC) 15 MG tablet Take 15 mg by mouth as needed.   promethazine (PHENERGAN) 25 MG tablet Take 1 tablet (25 mg total) by mouth every 6 (six) hours as needed for nausea or vomiting.   RABEprazole (ACIPHEX) 20 MG tablet Take 20 mg by mouth daily.   Respiratory Therapy Supplies (FULL KIT NEBULIZER SET) MISC Nebulizer machine of patient's choice, please include tubes and hoses as needed.  Use as directed.   rizatriptan (MAXALT) 10 MG tablet Take 1 tablet (10 mg total) by mouth as needed for migraine. APPT FOR REFILLS   venlafaxine XR (EFFEXOR-XR) 150 MG 24 hr capsule TAKE 1 CAPSULE BY MOUTH DAILY  WITH BREAKFAST.   vitamin C (ASCORBIC ACID) 250 MG tablet Take 250 mg by mouth daily.    OBJECTIVE    BP 119/76   Pulse 74   Ht 5\' 1"  (1.549 m)   Wt 188 lb (85.3 kg)   LMP 10/20/2018   SpO2 97%   BMI 35.52 kg/m   Physical Exam Vitals and nursing note reviewed.  Constitutional:      General: She is not in acute distress.    Appearance: Normal appearance.  HENT:     Head: Normocephalic and atraumatic.     Right Ear: External ear normal.     Left Ear: External ear normal.     Nose: Nose normal.  Eyes:     Conjunctiva/sclera: Conjunctivae normal.  Cardiovascular:     Rate and Rhythm: Normal rate and regular  rhythm.  Pulmonary:     Effort: Pulmonary effort is normal.     Comments: Coarse breath sounds Neurological:     General: No focal deficit present.     Mental Status: She is alert and oriented to person, place, and time.  Psychiatric:        Mood and Affect: Mood normal.        Behavior: Behavior normal.        Thought Content: Thought content normal.        Judgment: Judgment normal.        ASSESSMENT & PLAN    Problem List Items Addressed This Visit       Respiratory   Other acute recurrent sinusitis - Primary    Patient still having signs and symptoms of sinusitis.  Have gone ahead and started patient on doxycycline.  Follow-up next week to assess efficacy.      Relevant Medications   doxycycline (VIBRA-TABS) 100 MG tablet   benzonatate (TESSALON) 100 MG capsule     Other   Acute cough    Patient had acute cough in clinic today have gone ahead and given nebulizer treatment.  Have also ordered nebulizer kit and solution for patient. Lung sounds improvement post nebulizer treatment - Have given Tessalon Perles as well as an albuterol inhaler      Relevant Medications   albuterol (VENTOLIN HFA) 108 (90 Base) MCG/ACT inhaler   benzonatate (TESSALON) 100 MG capsule    Return in about 2 weeks (around 07/15/2022), or if symptoms worsen or fail to improve.      Meds ordered this encounter  Medications   doxycycline (VIBRA-TABS) 100 MG tablet    Sig: Take 1 tablet (100 mg total) by mouth 2 (two) times daily for 7 days.    Dispense:  14 tablet    Refill:  0   albuterol (VENTOLIN HFA) 108 (90 Base) MCG/ACT inhaler    Sig: Inhale 2 puffs into the lungs every 6 (six) hours as needed for wheezing or shortness of breath.    Dispense:  8 g    Refill:  0   benzonatate (TESSALON) 100 MG capsule    Sig: Take 1 capsule (100 mg total) by mouth 3 (three) times daily as needed for up to 10 days for cough.    Dispense:  30 capsule    Refill:  0   Respiratory Therapy Supplies (FULL  KIT NEBULIZER SET) MISC    Sig: Nebulizer machine of patient's choice, please include tubes and hoses as needed.  Use as directed.    Dispense:  1 each    Refill:  0   albuterol (  PROVENTIL) (2.5 MG/3ML) 0.083% nebulizer solution    Sig: Take 3 mLs (2.5 mg total) by nebulization every 6 (six) hours as needed for wheezing or shortness of breath.    Dispense:  150 mL    Refill:  1    No orders of the defined types were placed in this encounter.    Charlton Amor, DO  [facility] at [facility] [phone] (phone) [phone] (fax)  [facility]

## 2022-07-02 ENCOUNTER — Telehealth: Payer: Self-pay

## 2022-07-02 NOTE — Telephone Encounter (Signed)
After hours RN line received a call from the patient. Per the patient, [facility] no longer carry the nebulizer machine. Denies any new or worsening symptoms at time of call. Please advise, thanks.

## 2022-07-03 NOTE — Telephone Encounter (Signed)
Contacted the patient, no answer. Left a detailed msg on Id'd vm regarding the provider's recommendation. Patient informed to return a call back to the clinic with a response. Direct call back information provided.

## 2022-07-16 NOTE — Telephone Encounter (Signed)
Second attempt to contact the patient, no answer. Left a detailed msg on Id'd vm regarding the provider's recommendation. Patient informed to return a call back to the clinic with a response. Direct call back information provided

## 2022-08-07 ENCOUNTER — Encounter: Payer: Self-pay | Admitting: Family Medicine

## 2022-08-11 MED ORDER — WEGOVY 0.25 MG/0.5ML ~~LOC~~ SOAJ: 0.2500 mg | SUBCUTANEOUS | 0 refills | Status: DC

## 2022-08-12 ENCOUNTER — Telehealth: Payer: Self-pay

## 2022-08-12 NOTE — Telephone Encounter (Signed)
Initiated Prior authorization [id number] 0.25MG /0.5ML auto-injectors Via: Covermymeds Case/Key:[id number] Status: approved as of 08/12/22 Reason:Authorization Expiration Date: March 14, 2023. Notified Pt via: Mychart

## 2022-08-16 DIAGNOSIS — E119 Type 2 diabetes mellitus without complications: Secondary | ICD-10-CM | POA: Diagnosis not present

## 2022-08-16 DIAGNOSIS — E282 Polycystic ovarian syndrome: Secondary | ICD-10-CM | POA: Diagnosis not present

## 2022-08-16 DIAGNOSIS — K760 Fatty (change of) liver, not elsewhere classified: Secondary | ICD-10-CM | POA: Diagnosis not present

## 2022-08-16 DIAGNOSIS — G43009 Migraine without aura, not intractable, without status migrainosus: Secondary | ICD-10-CM | POA: Diagnosis not present

## 2022-08-16 DIAGNOSIS — G4733 Obstructive sleep apnea (adult) (pediatric): Secondary | ICD-10-CM | POA: Diagnosis not present

## 2022-08-16 DIAGNOSIS — K219 Gastro-esophageal reflux disease without esophagitis: Secondary | ICD-10-CM | POA: Diagnosis not present

## 2022-08-16 DIAGNOSIS — G459 Transient cerebral ischemic attack, unspecified: Secondary | ICD-10-CM | POA: Diagnosis not present

## 2022-08-16 DIAGNOSIS — F418 Other specified anxiety disorders: Secondary | ICD-10-CM | POA: Diagnosis not present

## 2022-08-16 DIAGNOSIS — Z9884 Bariatric surgery status: Secondary | ICD-10-CM | POA: Diagnosis not present

## 2022-08-16 DIAGNOSIS — I639 Cerebral infarction, unspecified: Secondary | ICD-10-CM | POA: Diagnosis not present

## 2022-08-16 DIAGNOSIS — R29818 Other symptoms and signs involving the nervous system: Secondary | ICD-10-CM | POA: Diagnosis not present

## 2022-08-16 DIAGNOSIS — E785 Hyperlipidemia, unspecified: Secondary | ICD-10-CM | POA: Diagnosis not present

## 2022-08-16 DIAGNOSIS — Z6835 Body mass index (BMI) 35.0-35.9, adult: Secondary | ICD-10-CM | POA: Diagnosis not present

## 2022-08-16 DIAGNOSIS — G5600 Carpal tunnel syndrome, unspecified upper limb: Secondary | ICD-10-CM | POA: Diagnosis not present

## 2022-08-16 DIAGNOSIS — I10 Essential (primary) hypertension: Secondary | ICD-10-CM | POA: Diagnosis not present

## 2022-08-16 DIAGNOSIS — I6782 Cerebral ischemia: Secondary | ICD-10-CM | POA: Diagnosis not present

## 2022-08-16 DIAGNOSIS — E041 Nontoxic single thyroid nodule: Secondary | ICD-10-CM | POA: Diagnosis not present

## 2022-08-16 DIAGNOSIS — G43109 Migraine with aura, not intractable, without status migrainosus: Secondary | ICD-10-CM | POA: Diagnosis not present

## 2022-08-16 DIAGNOSIS — M1990 Unspecified osteoarthritis, unspecified site: Secondary | ICD-10-CM | POA: Diagnosis not present

## 2022-08-16 DIAGNOSIS — J310 Chronic rhinitis: Secondary | ICD-10-CM | POA: Diagnosis not present

## 2022-08-17 ENCOUNTER — Other Ambulatory Visit: Payer: Self-pay | Admitting: Family Medicine

## 2022-08-17 DIAGNOSIS — F418 Other specified anxiety disorders: Secondary | ICD-10-CM

## 2022-08-17 DIAGNOSIS — G459 Transient cerebral ischemic attack, unspecified: Secondary | ICD-10-CM | POA: Diagnosis not present

## 2022-08-17 DIAGNOSIS — R2990 Unspecified symptoms and signs involving the nervous system: Secondary | ICD-10-CM | POA: Diagnosis not present

## 2022-08-17 DIAGNOSIS — I639 Cerebral infarction, unspecified: Secondary | ICD-10-CM | POA: Diagnosis not present

## 2022-08-21 ENCOUNTER — Ambulatory Visit: Payer: BC Managed Care – PPO | Admitting: Family Medicine

## 2022-08-21 ENCOUNTER — Encounter: Payer: Self-pay | Admitting: Family Medicine

## 2022-08-21 ENCOUNTER — Other Ambulatory Visit: Payer: Self-pay | Admitting: Family Medicine

## 2022-08-21 VITALS — BP 122/62 | HR 78 | Ht 61.0 in | Wt 188.0 lb

## 2022-08-21 DIAGNOSIS — E041 Nontoxic single thyroid nodule: Secondary | ICD-10-CM | POA: Diagnosis not present

## 2022-08-21 DIAGNOSIS — R0981 Nasal congestion: Secondary | ICD-10-CM

## 2022-08-21 DIAGNOSIS — J301 Allergic rhinitis due to pollen: Secondary | ICD-10-CM

## 2022-08-21 DIAGNOSIS — F418 Other specified anxiety disorders: Secondary | ICD-10-CM

## 2022-08-21 DIAGNOSIS — G43809 Other migraine, not intractable, without status migrainosus: Secondary | ICD-10-CM | POA: Diagnosis not present

## 2022-08-21 MED ORDER — AZELASTINE-FLUTICASONE 137-50 MCG/ACT NA SUSP
1.00 | Freq: Two times a day (BID) | NASAL | 1 refills | Status: AC
Start: 2022-08-21 — End: ?

## 2022-08-21 NOTE — Progress Notes (Signed)
Established Patient Office Visit  Subjective   Patient ID: Tonya Phillips, female    DOB: 08-Mar-1970  Age: 52 y.o. MRN: [id number]  Chief Complaint  Patient presents with   Hospitalization Follow-up   Migraine    Migraine on 06/28 States she's had a headache everyday since then [facility] test done at hospital came back normal   Referral    Found nodule on right thyroid possible referral needed     HPI  Here today for hospital follow-up.  She went to the emergency department on June 30.  She started to get a headache and tried to take her rescue medication but it did not work then she started noticing some numbness and tingling in her face and so went to a local urgent care.  They then referred her to the emergency room.  While she was there they had a difficult time getting venous access and so she ended up having an anxiety attack and her blood pressure and pulse shot up.  While there they did a brain MRI to rule out stroke and an echocardiogram.  They also did an ultrasound of her thyroid.  Her eye was essentially normal.  Echocardiogram looked great.  Ultrasound of the thyroid revealed a 1.6 cm right-sided nodule that needs further evaluation.  She is feeling much better overall.  She reports that she actually had a very similar episode to this about 14 years ago when she lived in [place].  She still had a few more mild headaches since the ED visit.  But no numbness or tingling recurrence.  She has had a little bit more nasal congestion sneezing and itching.  She has been taking her Claritin and using Flonase but not getting her usual symptom relief.  Prior history of allergy shots years ago when she lived in [place].    ROS    Objective:     BP 122/62 (BP Location: Left Arm, Patient Position: Sitting, Cuff Size: Large)   Pulse 78   Ht 5\' 1"  (1.549 m)   Wt 188 lb 0.6 oz (85.3 kg)   LMP 10/20/2018   SpO2 96%   BMI 35.53 kg/m    Physical Exam   No results found  for any visits on 08/21/22.    The ASCVD Risk score (Arnett DK, et al., 2019) failed to calculate for the following reasons:   The patient has a prior MI or stroke diagnosis    Assessment & Plan:   Problem List Items Addressed This Visit   None Visit Diagnoses     Right thyroid nodule    -  Primary   Relevant Orders   [name] FNA BX THYROID 1ST LESION AFIRMA   Other migraine without status migrainosus, not intractable       Relevant Medications   aspirin EC 81 MG tablet   atorvastatin (LIPITOR) 80 MG tablet   Other Relevant Orders   Ambulatory referral to Neurology   Nasal congestion       Relevant Medications   Azelastine-Fluticasone 137-50 MCG/ACT SUSP   Seasonal allergic rhinitis due to pollen           Allergic rhinitis-will switch to Dymista instead of Flonase.  Consider switching Claritin to Allegra or Zyrtec.  If not improving consider referral to allergy specialist.  She has not been retested for allergies since moving to [place].  Complex migraine-I do agree that this was probably more consistent with a complex migraine versus a true TIA.  But  we will still go ahead and place referral to neurology for further workup and evaluation.  Right-sided thyroid nodule-will schedule for FNA.  No follow-ups on file.    Nani Gasser, MD

## 2022-09-04 ENCOUNTER — Telehealth: Payer: Self-pay | Admitting: Family Medicine

## 2022-09-04 NOTE — Telephone Encounter (Signed)
Tonya Phillips from [facility] called.  Received a referral with office notes for neurology. Patient is calling to schedule a thyroid biopsy. If referral is for thyroid biopsy it needs to be for an Ultrasound guided biopsy to the radiology dept.   [name] contact # [phone]/fax #[phone]

## 2022-09-07 NOTE — Telephone Encounter (Signed)
Insurance card, clinical notes and orders for US thyroid biopsy have been re-faxed to [facility] at [phone].  Referral, clinical notes and copies of insurance cards have been re-faxed to [facility] at [phone]. Office will contact patient to schedule referral appointment.

## 2022-09-17 ENCOUNTER — Other Ambulatory Visit: Payer: Self-pay | Admitting: Family Medicine

## 2022-09-18 ENCOUNTER — Encounter: Payer: Self-pay | Admitting: Family Medicine

## 2022-09-18 ENCOUNTER — Telehealth: Payer: Self-pay | Admitting: Family Medicine

## 2022-09-18 NOTE — Telephone Encounter (Signed)
Patient called in to check on refill for [facility]. Please advise   [facility] [address] [place], [place]

## 2022-09-21 MED ORDER — WEGOVY 0.5 MG/0.5ML ~~LOC~~ SOAJ: 0.5000 mg | SUBCUTANEOUS | 0 refills | Status: DC

## 2022-09-21 NOTE — Telephone Encounter (Signed)
Meds ordered this encounter  Medications   Semaglutide-Weight Management (WEGOVY) 0.5 MG/0.5ML SOAJ    Sig: Inject 0.5 mg into the skin every 7 (seven) days.    Dispense:  2 mL    Refill:  0

## 2022-09-21 NOTE — Telephone Encounter (Signed)
Does she need the next dose?

## 2022-09-23 NOTE — Telephone Encounter (Signed)
Pt's medication has been sent to her pharmacy for the next dose 0.5 mg.

## 2022-09-28 DIAGNOSIS — G43009 Migraine without aura, not intractable, without status migrainosus: Secondary | ICD-10-CM | POA: Diagnosis not present

## 2022-10-24 ENCOUNTER — Encounter: Payer: Self-pay | Admitting: Family Medicine

## 2022-10-24 ENCOUNTER — Other Ambulatory Visit: Payer: Self-pay | Admitting: Family Medicine

## 2022-10-26 MED ORDER — WEGOVY 1 MG/0.5ML ~~LOC~~ SOAJ: 1.0000 mg | SUBCUTANEOUS | 0 refills | Status: DC

## 2022-10-26 NOTE — Telephone Encounter (Signed)
Requesting next strength of wegovy to  [facility] [address]  Last written 08 05 2024 Last ov [id number] No upcoming appt schld.

## 2022-11-11 ENCOUNTER — Other Ambulatory Visit: Payer: Self-pay | Admitting: Family Medicine

## 2022-11-11 DIAGNOSIS — F418 Other specified anxiety disorders: Secondary | ICD-10-CM

## 2022-11-23 ENCOUNTER — Encounter: Payer: Self-pay | Admitting: Family Medicine

## 2022-11-23 MED ORDER — WEGOVY 1.7 MG/0.75ML ~~LOC~~ SOAJ: 1.7000 mg | SUBCUTANEOUS | 1 refills | Status: DC

## 2022-11-23 NOTE — Telephone Encounter (Signed)
Meds ordered this encounter  Medications   Semaglutide-Weight Management (WEGOVY) 1.7 MG/0.75ML SOAJ    Sig: Inject 1.7 mg into the skin once a week.    Dispense:  3 mL    Refill:  1

## 2022-12-31 ENCOUNTER — Encounter: Payer: Self-pay | Admitting: Family Medicine

## 2022-12-31 MED ORDER — WEGOVY 1.7 MG/0.75ML ~~LOC~~ SOAJ: 1.7000 mg | SUBCUTANEOUS | 0 refills | Status: DC

## 2023-02-06 ENCOUNTER — Other Ambulatory Visit: Payer: Self-pay | Admitting: Family Medicine

## 2023-02-06 DIAGNOSIS — F418 Other specified anxiety disorders: Secondary | ICD-10-CM

## 2023-02-08 ENCOUNTER — Other Ambulatory Visit: Payer: Self-pay | Admitting: Family Medicine

## 2023-02-08 DIAGNOSIS — F418 Other specified anxiety disorders: Secondary | ICD-10-CM

## 2023-02-10 ENCOUNTER — Other Ambulatory Visit: Payer: Self-pay | Admitting: *Deleted

## 2023-02-10 DIAGNOSIS — F418 Other specified anxiety disorders: Secondary | ICD-10-CM

## 2023-02-11 ENCOUNTER — Telehealth: Payer: Self-pay | Admitting: Family Medicine

## 2023-02-11 MED ORDER — VENLAFAXINE HCL ER 150 MG PO CP24: 150.0000 mg | ORAL_CAPSULE | Freq: Every day | ORAL | 0 refills | Status: DC

## 2023-02-11 NOTE — Progress Notes (Signed)
Pls cal pt needs f/u ppt for meds in next 3 weeks.

## 2023-02-11 NOTE — Telephone Encounter (Signed)
Pls cal pt needs f/u ppt for meds in next 3 weeks.

## 2023-02-18 ENCOUNTER — Encounter: Payer: Self-pay | Admitting: Family Medicine

## 2023-02-19 ENCOUNTER — Telehealth: Payer: Self-pay

## 2023-02-19 DIAGNOSIS — F418 Other specified anxiety disorders: Secondary | ICD-10-CM

## 2023-02-19 MED ORDER — VENLAFAXINE HCL ER 150 MG PO CP24: 150.0000 mg | ORAL_CAPSULE | Freq: Every day | ORAL | 0 refills | Status: DC

## 2023-02-19 NOTE — Telephone Encounter (Signed)
 I called the pharmacy and the reason it was denied was because she has to have a 90 day prescription or the insurance will not pay. She has been scheduled for an appointment. A 90 prescription has been sent to the pharmacy. Patient is aware.

## 2023-02-19 NOTE — Telephone Encounter (Signed)
 Copied from CRM [id number]. Topic: Clinical - Medical Advice >> Feb 19, 2023  9:00 AM Joesph PARAS wrote: Reason for CRM: Patient calling upset to state that CMA gave her a call and tried to tell her that her doctor will not be helping her. States she was told that there are no alternatives, alternatives cannot be prescribed, and she has four pills left. She states she is unable to get into contact with anyone and feels that she is not getting any help. Patient requesting alternative or advice regarding venlafaxine  XR (EFFEXOR -XR) 150 MG 24 hr capsule.

## 2023-02-19 NOTE — Telephone Encounter (Signed)
 I called the pharmacy and they state the prescription has to be sent for a 90 day supply. Her insurance will not pay for a 30 day supply. She has been scheduled for 03/01/23. 90 has been sent to the pharmacy.

## 2023-02-19 NOTE — Telephone Encounter (Signed)
 Capsule ws sent on 02/11/23 but not covered by insurance.  Copied from CRM [id number]. Topic: Clinical - Prescription Issue >> Feb 18, 2023  4:18 PM Joesph PARAS wrote: Reason for CRM:  venlafaxine  XR (EFFEXOR -XR) 150 MG 24 hr capsule if no longer covered by her insurance. Patient requesting an alternative be prescribed. Patient is running out of medication and pharmacy states they haven't receied reply to fax sent on 12/26.

## 2023-02-19 NOTE — Telephone Encounter (Signed)
 Can we call the pharmacy this just seems a little strange to me it has been generic for over a decade I do not know why they would not cover it anymore.  If it truly is not covered then she may want to use a GoodRx coupon.  [facility] with [facility], and $27 for 90-day supply at [facility], or $30 at [facility] for 90-day supply.  I can change her medication but she did have to taper off of this 1 which means she would still have to pick up her prescription of a lower strength and then I had have to switch her to something completely different.  Since she is doing well on this 1 I would rather find a way to make sure that it is covered.

## 2023-02-20 ENCOUNTER — Other Ambulatory Visit: Payer: Self-pay | Admitting: Family Medicine

## 2023-02-20 DIAGNOSIS — F418 Other specified anxiety disorders: Secondary | ICD-10-CM

## 2023-03-01 ENCOUNTER — Ambulatory Visit: Payer: BC Managed Care – PPO | Admitting: Family Medicine

## 2023-03-01 ENCOUNTER — Encounter: Payer: Self-pay | Admitting: Family Medicine

## 2023-03-01 VITALS — BP 130/73 | HR 70 | Ht 61.0 in | Wt 193.0 lb

## 2023-03-01 DIAGNOSIS — Z1231 Encounter for screening mammogram for malignant neoplasm of breast: Secondary | ICD-10-CM | POA: Diagnosis not present

## 2023-03-01 DIAGNOSIS — Z7689 Persons encountering health services in other specified circumstances: Secondary | ICD-10-CM | POA: Diagnosis not present

## 2023-03-01 DIAGNOSIS — F418 Other specified anxiety disorders: Secondary | ICD-10-CM

## 2023-03-01 NOTE — Progress Notes (Signed)
   Established Patient Office Visit  Subjective  Patient ID: TIAJA HAGAN, female    DOB: 1970-09-20  Age: 53 y.o. MRN: [id number]  Chief Complaint  Patient presents with   mood    HPI  Follow-up depression/anxiety-overall doing well on current regimen no concerns.  She changed jobs within her company this summer and it has been a great move she has been very happy with the changes.  Regards to weight management she had some increased nausea with the Wegovy  and just could not quite get through it and then started losing a lot of hair so she ended up stopping it she did lose about 10 to 12 pounds while she was on the medication.  On her own she is lost a couple pounds this past week with making some changes.    ROS    Objective:     BP 130/73   Pulse 70   Ht 5' 1 (1.549 m)   Wt 193 lb (87.5 kg)   LMP 10/20/2018   SpO2 93%   BMI 36.47 kg/m    Physical Exam Vitals and nursing note reviewed.  Constitutional:      Appearance: Normal appearance.  HENT:     Head: Normocephalic and atraumatic.  Eyes:     Conjunctiva/sclera: Conjunctivae normal.  Cardiovascular:     Rate and Rhythm: Normal rate and regular rhythm.  Pulmonary:     Effort: Pulmonary effort is normal.     Breath sounds: Normal breath sounds.  Skin:    General: Skin is warm and dry.  Neurological:     Mental Status: She is alert.  Psychiatric:        Mood and Affect: Mood normal.      No results found for any visits on 03/01/23.    The ASCVD Risk score (Arnett DK, et al., 2019) failed to calculate for the following reasons:   Risk score cannot be calculated because patient has a medical history suggesting prior/existing ASCVD    Assessment & Plan:   Problem List Items Addressed This Visit       Other   Encounter for weight management   She wants to continue to work on some lifestyle changes first since she is already making some good progress on her own.  We could consider initiating  Zepbound  if not being able to reach her goals on her own.      Depression with anxiety - Primary   Follow-up depression with anxiety-happy with current dose of venlafaxine .  There was an issue at the pharmacy because they required a 90-day supply instead of a 30 right now she has her medication and is doing well.  Plan to follow-up in 6 months.      Other Visit Diagnoses       Screening mammogram for breast cancer       Relevant Orders   MM 3D SCREENING MAMMOGRAM BILATERAL BREAST      Encouraged her to schedule her mammogram   Return in about 6 months (around 08/29/2023) for Mood .    Dorothyann Byars, MD

## 2023-03-01 NOTE — Assessment & Plan Note (Signed)
 She wants to continue to work on some lifestyle changes first since she is already making some good progress on her own.  We could consider initiating Zepbound if not being able to reach her goals on her own.

## 2023-03-01 NOTE — Assessment & Plan Note (Signed)
 Follow-up depression with anxiety-happy with current dose of venlafaxine.  There was an issue at the pharmacy because they required a 90-day supply instead of a 30 right now she has her medication and is doing well.  Plan to follow-up in 6 months.

## 2023-03-15 ENCOUNTER — Encounter: Payer: Self-pay | Admitting: Family Medicine

## 2023-03-15 DIAGNOSIS — Z7689 Persons encountering health services in other specified circumstances: Secondary | ICD-10-CM

## 2023-03-15 DIAGNOSIS — K2100 Gastro-esophageal reflux disease with esophagitis, without bleeding: Secondary | ICD-10-CM

## 2023-03-15 DIAGNOSIS — G4733 Obstructive sleep apnea (adult) (pediatric): Secondary | ICD-10-CM

## 2023-03-15 DIAGNOSIS — K760 Fatty (change of) liver, not elsewhere classified: Secondary | ICD-10-CM

## 2023-03-15 DIAGNOSIS — E282 Polycystic ovarian syndrome: Secondary | ICD-10-CM

## 2023-03-15 DIAGNOSIS — E66812 Obesity, class 2: Secondary | ICD-10-CM

## 2023-03-17 MED ORDER — TIRZEPATIDE-WEIGHT MANAGEMENT 2.5 MG/0.5ML ~~LOC~~ SOLN: 2.5000 mg | SUBCUTANEOUS | 0 refills | Status: DC

## 2023-03-17 NOTE — Telephone Encounter (Signed)
Meds ordered this encounter  Medications   tirzepatide (ZEPBOUND) 2.5 MG/0.5ML injection vial    Sig: Inject 2.5 mg into the skin once a week. For Sleep Apnea    Dispense:  2 mL    Refill:  0   Please intiate PA

## 2023-03-29 NOTE — Telephone Encounter (Signed)
 Zepbound 2.5mg   PA initiated  [employer] (Key: [id number]) PA Case ID #: [id number] Need Help? Call us  at [phone] Status sent iconSent to Plan today Drug Zepbound 2.5MG /0.5ML pen-injectors ePA cloud Psychologist, educational Electronic PA Form [phone] NCPDP)

## 2023-03-30 NOTE — Telephone Encounter (Signed)
PA resubmitted -  Tonya Phillips (Key: [id number]) PA Case ID #: [id number] Need Help? Call us at [phone] Status sent iconSent to Plan today Drug Zepbound 2.5MG /0.5ML pen-injectors ePA cloud Psychologist, educational Electronic PA Form [phone] NCPDP)

## 2023-03-31 NOTE — Telephone Encounter (Signed)
Tonya Phillips (KeyFelipa Emory) - [id number] Zepbound 2.5MG /0.5ML pen-injectors status: PA Response - ApprovedCreated: February 11th, 2025Sent: February 11th, 2025

## 2023-03-31 NOTE — Telephone Encounter (Signed)
Message sent to patient regarding approval.

## 2023-04-09 ENCOUNTER — Encounter: Payer: Self-pay | Admitting: Family Medicine

## 2023-04-09 ENCOUNTER — Telehealth: Payer: BC Managed Care – PPO | Admitting: Physician Assistant

## 2023-04-09 DIAGNOSIS — J0181 Other acute recurrent sinusitis: Secondary | ICD-10-CM

## 2023-04-09 MED ORDER — BENZONATATE 100 MG PO CAPS: 100.0000 mg | ORAL_CAPSULE | Freq: Three times a day (TID) | ORAL | 0 refills | Status: DC | PRN

## 2023-04-09 MED ORDER — ALBUTEROL SULFATE HFA 108 (90 BASE) MCG/ACT IN AERS
2.0000 | INHALATION_SPRAY | Freq: Four times a day (QID) | RESPIRATORY_TRACT | 0 refills | Status: AC | PRN
Start: 2023-04-09 — End: ?

## 2023-04-09 MED ORDER — AZITHROMYCIN 250 MG PO TABS: ORAL_TABLET | ORAL | 0 refills | Status: AC

## 2023-04-09 NOTE — Patient Instructions (Signed)
 Signe Colt, thank you for joining [name], PA-C for today's virtual visit.  While this provider is not your primary care provider (PCP), if your PCP is located in our provider database this encounter information will be shared with them immediately following your visit.   A [facility] MyChart account gives you access to today's visit and all your visits, tests, and labs performed at [facility] " click here if you don't have a [facility] MyChart account or go to [email or web address]  Consent: (Patient) Tonya Phillips provided verbal consent for this virtual visit at the beginning of the encounter.  Current Medications:  Current Outpatient Medications:    albuterol (VENTOLIN HFA) 108 (90 Base) MCG/ACT inhaler, Inhale 2 puffs into the lungs every 6 (six) hours as needed for wheezing or shortness of breath., Disp: 8 g, Rfl: 0   azithromycin (ZITHROMAX) 250 MG tablet, Take 2 tablets on day 1, then 1 tablet daily on days 2 through 5, Disp: 6 tablet, Rfl: 0   benzonatate (TESSALON) 100 MG capsule, Take 1 capsule (100 mg total) by mouth 3 (three) times daily as needed., Disp: 20 capsule, Rfl: 0   AMBULATORY NON FORMULARY MEDICATION, Medication Name: CPAP, humidifier and supplies set to 12 cm water pressure. . Dx OSA. [employer]ocare., Disp: 1 vial, Rfl: 0   aspirin EC 81 MG tablet, Take by mouth., Disp: , Rfl:    atorvastatin (LIPITOR) 80 MG tablet, Take by mouth., Disp: , Rfl:    Azelastine-Fluticasone 137-50 MCG/ACT SUSP, Place 1 spray into the nose every 12 (twelve) hours., Disp: 23 g, Rfl: 1   Biotin 2500 MCG CAPS, Take by mouth., Disp: , Rfl:    busPIRone (BUSPAR) 7.5 MG tablet, TAKE 1 TABLET BY MOUTH TWICE A DAY, Disp: 180 tablet, Rfl: 1   IRON-VITAMIN C PO, Take by mouth., Disp: , Rfl:    meloxicam (MOBIC) 15 MG tablet, Take 15 mg by mouth as needed., Disp: , Rfl:    Multiple Vitamin tablet, Take 1 tablet by mouth daily., Disp: , Rfl:    RABEprazole (ACIPHEX) 20 MG tablet,  Take 20 mg by mouth daily., Disp: , Rfl:    rizatriptan (MAXALT) 10 MG tablet, Take 1 tablet (10 mg total) by mouth as needed for migraine. APPT FOR REFILLS, Disp: 10 tablet, Rfl: 5   tirzepatide (ZEPBOUND) 2.5 MG/0.5ML injection vial, Inject 2.5 mg into the skin once a week. For Sleep Apnea, Disp: 2 mL, Rfl: 0   venlafaxine XR (EFFEXOR-XR) 150 MG 24 hr capsule, Take 1 capsule (150 mg total) by mouth daily with breakfast. Needs appointment., Disp: 90 capsule, Rfl: 0   vitamin C (ASCORBIC ACID) 250 MG tablet, Take 250 mg by mouth daily., Disp: , Rfl:    Medications ordered in this encounter:  Meds ordered this encounter  Medications   benzonatate (TESSALON) 100 MG capsule    Sig: Take 1 capsule (100 mg total) by mouth 3 (three) times daily as needed.    Dispense:  20 capsule    Refill:  0    Supervising Provider:   Merrilee Jansky [[id number]]   azithromycin (ZITHROMAX) 250 MG tablet    Sig: Take 2 tablets on day 1, then 1 tablet daily on days 2 through 5    Dispense:  6 tablet    Refill:  0    Supervising Provider:   Merrilee Jansky [[id number]]   albuterol (VENTOLIN HFA) 108 (90 Base) MCG/ACT inhaler    Sig: Inhale 2  puffs into the lungs every 6 (six) hours as needed for wheezing or shortness of breath.    Dispense:  8 g    Refill:  0    Supervising Provider:   Merrilee Jansky [[id number]]     *If you need refills on other medications prior to your next appointment, please contact your pharmacy*  Follow-Up: Call back or seek an in-person evaluation if the symptoms worsen or if the condition fails to improve as anticipated.  [facility] [phone]  Other Instructions Take azithromycin as prescribed. Can take tessalon as needed for cough.  Use inhaler as needed for wheezing.  Drink plenty of fluids, rest.  Recommend Mucinex and Flonase. If no improvement or symptoms become worse recommend in person evaluation with your PCP or Urgent Care.    If you have been  instructed to have an in-person evaluation today at a local Urgent Care facility, please use the link below. It will take you to a list of all of our available [facility], including address, phone number and hours of operation. Please do not delay care.  [facility]  If you or a family member do not have a primary care provider, use the link below to schedule a visit and establish care. When you choose a [facility] primary care physician or advanced practice provider, you gain a long-term partner in health. Find a Primary Care Provider  Learn more about [facility] in-office and virtual care options: [facility] - Get Care Now

## 2023-04-09 NOTE — Telephone Encounter (Signed)
 Copied from CRM [id number]. Topic: Clinical - Medical Advice >> Apr 09, 2023  8:43 AM Carlatta H wrote: Reason for CRM: Patient would like a call back from the nurse regarding chest congestion and cpap machine//

## 2023-04-09 NOTE — Progress Notes (Signed)
 Virtual Visit Consent   Tonya Phillips, you are scheduled for a virtual visit with a [facility] provider today. Just as with appointments in the office, your consent must be obtained to participate. Your consent will be active for this visit and any virtual visit you may have with one of our providers in the next 365 days. If you have a MyChart account, a copy of this consent can be sent to you electronically.  As this is a virtual visit, video technology does not allow for your provider to perform a traditional examination. This may limit your provider's ability to fully assess your condition. If your provider identifies any concerns that need to be evaluated in person or the need to arrange testing (such as labs, EKG, etc.), we will make arrangements to do so. Although advances in technology are sophisticated, we cannot ensure that it will always work on either your end or our end. If the connection with a video visit is poor, the visit may have to be switched to a telephone visit. With either a video or telephone visit, we are not always able to ensure that we have a secure connection.  By engaging in this virtual visit, you consent to the provision of healthcare and authorize for your insurance to be billed (if applicable) for the services provided during this visit. Depending on your insurance coverage, you may receive a charge related to this service.  I need to obtain your verbal consent now. Are you willing to proceed with your visit today? Tonya Phillips has provided verbal consent on 04/09/2023 for a virtual visit (video or telephone). Tonya Fantasia Ward, PA-C  Date: 04/09/2023 6:57 PM   Virtual Visit via Video Note   I, Tonya Phillips, connected with  Tonya Phillips  ([id number], 1970/09/19) on 04/09/23 at  6:45 PM EST by a video-enabled telemedicine application and verified that I am speaking with the correct person using two identifiers.  Location: Patient: Virtual Visit Location Patient:  Home Provider: Virtual Visit Location Provider: Home Office   I discussed the limitations of evaluation and management by telemedicine and the availability of in person appointments. The patient expressed understanding and agreed to proceed.    History of Present Illness: Tonya Phillips is a 53 y.o. who identifies as a female who was assigned female at birth, and is being seen today for sinus pressure, pain, ear pressure that started about 5 days ago. She is also experiencing cough, reports decreased. Reports fever for the first three days, that has resolved.  Reports intermittent mild wheezing, does not have a h/o asthma.  She denies shortness of breath or trouble breathing.   HPI: HPI  Problems:  Patient Active Problem List   Diagnosis Date Noted   Encounter for weight management 03/01/2023   History of sleeve gastrectomy 03/09/2022   H/O: hysterectomy 05/21/2021   Ovarian cyst 05/09/2021   Migraine without aura and without status migrainosus, not intractable 10/13/2019   NAFLD (nonalcoholic fatty liver disease) 13/09/6576   Obesity, Class II, BMI 35-39.9 12/22/2016   Gastroesophageal reflux disease 11/16/2016   HLD (hyperlipidemia) 09/22/2016   Depression with anxiety 11/22/2015   Wrist pain, right 02/08/2015   Right ankle pain 10/02/2014   Carpal tunnel syndrome 10/02/2014   Chronic rhinitis 09/19/2014   Osteoarthritis of right knee 09/19/2014   PCOS (polycystic ovarian syndrome) 09/04/2014   OSA on CPAP 09/04/2014    Allergies:  Allergies  Allergen Reactions   Semaglutide Other (See Comments)  Pt reported that [facility] caused her to experience hair loss and nausea   Medications:  Current Outpatient Medications:    albuterol (VENTOLIN HFA) 108 (90 Base) MCG/ACT inhaler, Inhale 2 puffs into the lungs every 6 (six) hours as needed for wheezing or shortness of breath., Disp: 8 g, Rfl: 0   azithromycin (ZITHROMAX) 250 MG tablet, Take 2 tablets on day 1, then 1 tablet daily on  days 2 through 5, Disp: 6 tablet, Rfl: 0   benzonatate (TESSALON) 100 MG capsule, Take 1 capsule (100 mg total) by mouth 3 (three) times daily as needed., Disp: 20 capsule, Rfl: 0   AMBULATORY NON FORMULARY MEDICATION, Medication Name: CPAP, humidifier and supplies set to 12 cm water pressure. . Dx OSA. [employer]., Disp: 1 vial, Rfl: 0   aspirin EC 81 MG tablet, Take by mouth., Disp: , Rfl:    atorvastatin (LIPITOR) 80 MG tablet, Take by mouth., Disp: , Rfl:    Azelastine-Fluticasone 137-50 MCG/ACT SUSP, Place 1 spray into the nose every 12 (twelve) hours., Disp: 23 g, Rfl: 1   Biotin 2500 MCG CAPS, Take by mouth., Disp: , Rfl:    busPIRone (BUSPAR) 7.5 MG tablet, TAKE 1 TABLET BY MOUTH TWICE A DAY, Disp: 180 tablet, Rfl: 1   IRON-VITAMIN C PO, Take by mouth., Disp: , Rfl:    meloxicam (MOBIC) 15 MG tablet, Take 15 mg by mouth as needed., Disp: , Rfl:    Multiple Vitamin tablet, Take 1 tablet by mouth daily., Disp: , Rfl:    RABEprazole (ACIPHEX) 20 MG tablet, Take 20 mg by mouth daily., Disp: , Rfl:    rizatriptan (MAXALT) 10 MG tablet, Take 1 tablet (10 mg total) by mouth as needed for migraine. APPT FOR REFILLS, Disp: 10 tablet, Rfl: 5   tirzepatide (ZEPBOUND) 2.5 MG/0.5ML injection vial, Inject 2.5 mg into the skin once a week. For Sleep Apnea, Disp: 2 mL, Rfl: 0   venlafaxine XR (EFFEXOR-XR) 150 MG 24 hr capsule, Take 1 capsule (150 mg total) by mouth daily with breakfast. Needs appointment., Disp: 90 capsule, Rfl: 0   vitamin C (ASCORBIC ACID) 250 MG tablet, Take 250 mg by mouth daily., Disp: , Rfl:   Observations/Objective: Patient is well-developed, well-nourished in no acute distress.  Resting comfortably at home.  Head is normocephalic, atraumatic.  No labored breathing.  Speech is clear and coherent with logical content.  Patient is alert and oriented at baseline.    Assessment and Plan: 1. Other acute recurrent sinusitis (Primary)  Supportive care discussed.  Antibiotic  prescribed.  Tessalon prescribed. Albuterol inhaler prescribed to use as needed.  In person evaluation precautions discussed.   Follow Up Instructions: I discussed the assessment and treatment plan with the patient. The patient was provided an opportunity to ask questions and all were answered. The patient agreed with the plan and demonstrated an understanding of the instructions.  A copy of instructions were sent to the patient via MyChart unless otherwise noted below.     The patient was advised to call back or seek an in-person evaluation if the symptoms worsen or if the condition fails to improve as anticipated.    Tonya Fantasia Ward, PA-C

## 2023-04-25 ENCOUNTER — Encounter: Payer: Self-pay | Admitting: Family Medicine

## 2023-04-26 NOTE — Telephone Encounter (Signed)
 Attempted call to patient. Left a voice mail message requesting a return call. Will send a Mychart message as well.

## 2023-04-27 ENCOUNTER — Encounter: Payer: Self-pay | Admitting: Family Medicine

## 2023-04-27 ENCOUNTER — Other Ambulatory Visit: Payer: Self-pay | Admitting: *Deleted

## 2023-04-27 DIAGNOSIS — G4733 Obstructive sleep apnea (adult) (pediatric): Secondary | ICD-10-CM

## 2023-04-27 DIAGNOSIS — Z7689 Persons encountering health services in other specified circumstances: Secondary | ICD-10-CM

## 2023-04-27 DIAGNOSIS — K2100 Gastro-esophageal reflux disease with esophagitis, without bleeding: Secondary | ICD-10-CM

## 2023-04-27 DIAGNOSIS — K760 Fatty (change of) liver, not elsewhere classified: Secondary | ICD-10-CM

## 2023-04-27 DIAGNOSIS — E282 Polycystic ovarian syndrome: Secondary | ICD-10-CM

## 2023-04-27 DIAGNOSIS — E66812 Obesity, class 2: Secondary | ICD-10-CM

## 2023-04-27 MED ORDER — TIRZEPATIDE-WEIGHT MANAGEMENT 5 MG/0.5ML ~~LOC~~ SOLN: 5.0000 mg | SUBCUTANEOUS | 0 refills | Status: DC

## 2023-05-16 ENCOUNTER — Other Ambulatory Visit: Payer: Self-pay | Admitting: Family Medicine

## 2023-05-16 DIAGNOSIS — F418 Other specified anxiety disorders: Secondary | ICD-10-CM

## 2023-06-01 ENCOUNTER — Encounter: Payer: Self-pay | Admitting: Family Medicine

## 2023-06-01 MED ORDER — TIRZEPATIDE-WEIGHT MANAGEMENT 7.5 MG/0.5ML ~~LOC~~ SOLN: 7.5000 mg | SUBCUTANEOUS | 0 refills | Status: DC

## 2023-08-02 ENCOUNTER — Encounter: Payer: Self-pay | Admitting: Family Medicine

## 2023-08-02 ENCOUNTER — Other Ambulatory Visit: Payer: Self-pay | Admitting: Family Medicine

## 2023-08-02 DIAGNOSIS — E66812 Obesity, class 2: Secondary | ICD-10-CM

## 2023-08-02 DIAGNOSIS — E282 Polycystic ovarian syndrome: Secondary | ICD-10-CM

## 2023-08-02 DIAGNOSIS — K760 Fatty (change of) liver, not elsewhere classified: Secondary | ICD-10-CM

## 2023-08-02 DIAGNOSIS — G4733 Obstructive sleep apnea (adult) (pediatric): Secondary | ICD-10-CM

## 2023-08-02 DIAGNOSIS — Z7689 Persons encountering health services in other specified circumstances: Secondary | ICD-10-CM

## 2023-08-02 MED ORDER — TIRZEPATIDE-WEIGHT MANAGEMENT 5 MG/0.5ML ~~LOC~~ SOLN: 5.0000 mg | SUBCUTANEOUS | 0 refills | Status: DC

## 2023-08-02 NOTE — Addendum Note (Signed)
 Addended by: Wilbert Schouten D on: 08/02/2023 05:05 PM   Modules accepted: Orders

## 2023-08-03 NOTE — Telephone Encounter (Signed)
 Changes Requested   ZEPBOUND  5 MG/0.5ML injection vial       Changed from: tirzepatide  5 MG/0.5ML injection vial   Sig: INJECT 5 MG SUBCUTANEOUSLY WEEKLY   Disp: 2 mL    Refills: 0   Start: 08/02/2023   Class: Normal   Non-formulary For: OSA on CPAP   Last ordered: Yesterday (08/02/2023) by Cydney Draft, MD   Last refill: 08/02/2023   Rx #: [id number]   Pharmacy comment: Alternative Requested:NO LONGER COVERED. MAYBE PA.   Off-Protocol Failed06/16/2025 05:30 PM  Protocol Details Medication not assigned to a protocol, review manually.   Valid encounter within last 12 months    This request has changes from the previous prescription.  [facility] at[id number] [place] [place] - [address]     Dr. Greer Leak, please advise on this.

## 2023-08-06 MED ORDER — ZEPBOUND 5 MG/0.5ML ~~LOC~~ SOAJ: 5.0000 mg | SUBCUTANEOUS | 0 refills | Status: DC

## 2023-08-06 MED ORDER — ZEPBOUND 7.5 MG/0.5ML ~~LOC~~ SOAJ: 7.5000 mg | SUBCUTANEOUS | 0 refills | Status: DC

## 2023-08-06 NOTE — Addendum Note (Signed)
 Addended by: Doretha Ganja on: 08/06/2023 10:06 AM   Modules accepted: Orders

## 2023-08-06 NOTE — Telephone Encounter (Signed)
 Looks like this probably needs a prior authorization.

## 2023-08-10 ENCOUNTER — Other Ambulatory Visit (HOSPITAL_COMMUNITY): Payer: Self-pay

## 2023-08-10 ENCOUNTER — Telehealth: Payer: Self-pay

## 2023-08-10 NOTE — Telephone Encounter (Signed)
 Pharmacy Patient Advocate Encounter   Received notification from RX Request Messages that prior authorization for Zepbound  5mg /0.49ml is required/requested.   Insurance verification completed.   The patient is insured through [facility] .   Per test claim: PA required; PA submitted to above mentioned insurance via CoverMyMeds Key/confirmation #/EOC [facility] Status is pending

## 2023-08-12 ENCOUNTER — Other Ambulatory Visit (HOSPITAL_COMMUNITY): Payer: Self-pay

## 2023-08-12 ENCOUNTER — Telehealth: Payer: Self-pay

## 2023-08-12 NOTE — Telephone Encounter (Signed)
 [facility] [employer] mailed a letter stating the Zepbound  will no longer be covered though her plan.   I called and left a message for a return call.  Wegovy  is a covered product.

## 2023-08-12 NOTE — Telephone Encounter (Signed)
 See MyChart message

## 2023-08-12 NOTE — Telephone Encounter (Signed)
 Pharmacy Patient Advocate Encounter  Received notification from [facility] that Prior Authorization for Zepbound  5mg /0.4ml has been CANCELLED due to formulary change that will start on July 1st and to resubmit PA after July 1st.    Phone# [phone]

## 2023-08-13 MED ORDER — SEMAGLUTIDE-WEIGHT MANAGEMENT 2.4 MG/0.75ML ~~LOC~~ SOAJ: 2.4000 mg | SUBCUTANEOUS | 0 refills | Status: DC

## 2023-08-13 MED ORDER — SEMAGLUTIDE-WEIGHT MANAGEMENT 0.25 MG/0.5ML ~~LOC~~ SOAJ
0.2500 mg | SUBCUTANEOUS | 0 refills | Status: AC
Start: 2023-08-13 — End: 2023-09-10

## 2023-08-13 MED ORDER — SEMAGLUTIDE-WEIGHT MANAGEMENT 1 MG/0.5ML ~~LOC~~ SOAJ: 1.0000 mg | SUBCUTANEOUS | 0 refills | Status: DC

## 2023-08-13 MED ORDER — SEMAGLUTIDE-WEIGHT MANAGEMENT 0.5 MG/0.5ML ~~LOC~~ SOAJ: 0.5000 mg | SUBCUTANEOUS | 0 refills | Status: DC

## 2023-08-13 MED ORDER — SEMAGLUTIDE-WEIGHT MANAGEMENT 1.7 MG/0.75ML ~~LOC~~ SOAJ: 1.7000 mg | SUBCUTANEOUS | 0 refills | Status: DC

## 2023-08-13 NOTE — Addendum Note (Signed)
 Addended by: Latyra Jaye D on: 08/13/2023 02:33 PM   Modules accepted: Orders

## 2023-08-17 ENCOUNTER — Other Ambulatory Visit: Payer: Self-pay | Admitting: Family Medicine

## 2023-08-17 DIAGNOSIS — F418 Other specified anxiety disorders: Secondary | ICD-10-CM

## 2023-08-18 ENCOUNTER — Other Ambulatory Visit (HOSPITAL_COMMUNITY): Payer: Self-pay

## 2023-08-18 NOTE — Telephone Encounter (Signed)
 Zepbound  is no longer covered by the plan. Provider changed to Wegovy .

## 2023-08-22 NOTE — Telephone Encounter (Signed)
 Call pt she will need an OV for refills on Venlafaxine 

## 2023-08-23 NOTE — Telephone Encounter (Signed)
 Scheduled

## 2023-08-25 ENCOUNTER — Telehealth: Payer: Self-pay

## 2023-08-25 ENCOUNTER — Encounter: Payer: Self-pay | Admitting: Family Medicine

## 2023-08-25 ENCOUNTER — Other Ambulatory Visit: Payer: Self-pay

## 2023-08-25 DIAGNOSIS — F418 Other specified anxiety disorders: Secondary | ICD-10-CM

## 2023-08-25 MED ORDER — VENLAFAXINE HCL ER 150 MG PO CP24
150.0000 mg | ORAL_CAPSULE | Freq: Every day | ORAL | 0 refills | Status: DC
Start: 2023-08-25 — End: 2023-11-19

## 2023-08-25 NOTE — Telephone Encounter (Signed)
 Spoke with pharmacy . They had not received the prescription sent on 08/22/2023.  Resent to pharmacy and sent patient a Mychart message

## 2023-08-25 NOTE — Telephone Encounter (Signed)
 Copied from CRM [id number]. Topic: Clinical - Prescription Issue >> Aug 25, 2023 12:26 PM Adrianna P wrote: Reason for CRM: patient was supposed to have 90 day supply of venlafaxine  XR (EFFEXOR -XR) 150 MG 24 hr [facility] [address] Patient is going out of town today and needs it asap, she started this process last week, and still has not received her medicine

## 2023-08-25 NOTE — Telephone Encounter (Signed)
 Attempted call to patient to verify that she was able to pickup the venlafaxine  prescription. Left a voice mail message requesting a return call.

## 2023-08-25 NOTE — Telephone Encounter (Signed)
 Spoke with pharmacy . They did not receive the prescription that was sent on 08/22/2023. Resent per protocol.  Sent patient a Mychart message with this information.

## 2023-10-04 ENCOUNTER — Telehealth: Payer: Self-pay | Admitting: Family Medicine

## 2023-10-04 ENCOUNTER — Encounter: Payer: Self-pay | Admitting: Family Medicine

## 2023-10-04 ENCOUNTER — Other Ambulatory Visit: Payer: Self-pay | Admitting: Family Medicine

## 2023-10-04 ENCOUNTER — Other Ambulatory Visit (HOSPITAL_COMMUNITY): Payer: Self-pay

## 2023-10-04 ENCOUNTER — Ambulatory Visit: Admitting: Family Medicine

## 2023-10-04 VITALS — BP 130/67 | HR 76 | Ht 61.0 in | Wt 199.8 lb

## 2023-10-04 DIAGNOSIS — R5383 Other fatigue: Secondary | ICD-10-CM

## 2023-10-04 DIAGNOSIS — E66812 Obesity, class 2: Secondary | ICD-10-CM

## 2023-10-04 DIAGNOSIS — Z7689 Persons encountering health services in other specified circumstances: Secondary | ICD-10-CM

## 2023-10-04 DIAGNOSIS — F418 Other specified anxiety disorders: Secondary | ICD-10-CM

## 2023-10-04 DIAGNOSIS — G43009 Migraine without aura, not intractable, without status migrainosus: Secondary | ICD-10-CM

## 2023-10-04 DIAGNOSIS — L659 Nonscarring hair loss, unspecified: Secondary | ICD-10-CM

## 2023-10-04 DIAGNOSIS — M25561 Pain in right knee: Secondary | ICD-10-CM | POA: Diagnosis not present

## 2023-10-04 DIAGNOSIS — M1711 Unilateral primary osteoarthritis, right knee: Secondary | ICD-10-CM | POA: Diagnosis not present

## 2023-10-04 DIAGNOSIS — G4733 Obstructive sleep apnea (adult) (pediatric): Secondary | ICD-10-CM

## 2023-10-04 MED ORDER — TIRZEPATIDE-WEIGHT MANAGEMENT 2.5 MG/0.5ML ~~LOC~~ SOLN: 2.5000 mg | SUBCUTANEOUS | 0 refills | Status: DC

## 2023-10-04 NOTE — Addendum Note (Signed)
 Addended by: Jaymond Waage D on: 10/04/2023 11:45 AM   Modules accepted: Orders

## 2023-10-04 NOTE — Telephone Encounter (Signed)
 Need report for CPAP download from [facility].  Patient having increased fatigue recently.

## 2023-10-04 NOTE — Progress Notes (Signed)
 Established Patient Office Visit  Subjective  Patient ID: Tonya Phillips, female    DOB: [birth year]  Age: 53 y.o. MRN: [id number]  Chief Complaint  Patient presents with   Medical Management of Chronic Issues    HPI  Follow-up depression with anxiety-  Insurance won't cover Wegovy  and it caused stomach issues as well as triggered a pretty severe migraine that lasted days.  She had been on Wegovy  previously and had some stomach issues as well as hair loss.  And then we were able to switch her to Zepbound  for few weeks and she actually did great and lost about 8 pounds.  Discussed the use of AI scribe software for clinical note transcription with the patient, who gave verbal consent to proceed.  History of Present Illness Tonya Phillips is a 53 year old female with severe sleep apnea who presents with medication intolerance and sleep disturbances.  Medication intolerance - Severe side effects from Wegovy , including 'ice pick headaches' radiating from the front to the back of the head, nausea, and gastrointestinal discomfort - Symptoms began shortly after starting Wegovy  three weeks ago, leading to discontinuation of the medication - Previously trialed Zepbound  for two weeks, resulting in an eight-pound weight loss and improved well-being without gastrointestinal issues or headaches  Sleep disturbances and daytime fatigue - Ongoing difficulty falling asleep, often not falling asleep until midnight - Wakes feeling unrested despite sleeping through the night - Uses CPAP machine for severe sleep apnea; obtains supplies through [facility] - Uncertainty regarding optimal function of CPAP machine - Daytime fatigue with sleepiness at her desk - Fatigue negatively impacting mood and work performance  Weight gain despite exercise - Continued weight gain despite regular exercise regimen - Attends gym Monday through Thursday and on Saturdays - Concerned about inability to lose weight and ongoing  weight gain despite efforts  Knee pain - Knee pain, particularly in the posterior aspect of the right knee - Pain affecting ability to exercise occ, will see ortho   Medication use - Currently taking medication for reflux - Currently taking buspirone  for mood  Additional stressors - Does not receive influenza vaccinations - Experiencing additional stress due to lack of air conditioning in her car      ROS    Objective:     BP 130/67   Pulse 76   Ht 5' 1 (1.549 m)   Wt 199 lb 12.8 oz (90.6 kg)   LMP 10/20/2018   SpO2 98%   BMI 37.75 kg/m    Physical Exam Vitals and nursing note reviewed.  Constitutional:      Appearance: Normal appearance.  HENT:     Head: Normocephalic and atraumatic.  Eyes:     Conjunctiva/sclera: Conjunctivae normal.  Cardiovascular:     Rate and Rhythm: Normal rate and regular rhythm.  Pulmonary:     Effort: Pulmonary effort is normal.     Breath sounds: Normal breath sounds.  Skin:    General: Skin is warm and dry.  Neurological:     Mental Status: She is alert.  Psychiatric:        Mood and Affect: Mood normal.      No results found for any visits on 10/04/23.    The ASCVD Risk score (Arnett DK, et al., 2019) failed to calculate for the following reasons:   Risk score cannot be calculated because patient has a medical history suggesting prior/existing ASCVD    Assessment & Plan:   Problem List Items Addressed  This Visit       Cardiovascular and Mediastinum   Migraine without aura and without status migrainosus, not intractable - Primary   Stable but did flare with the Wegovy .  Uses maxalt  for rescue.       Relevant Orders   CMP14+EGFR   CBC with Differential/Platelet   TSH   VITAMIN D  25 Hydroxy (Vit-D Deficiency, Fractures)     Respiratory   OSA on CPAP   Will try to gete download for CPAP from [facility].         Other   Obesity, Class II, BMI 35-39.9   Relevant Medications   tirzepatide  (ZEPBOUND ) 2.5  MG/0.5ML injection vial   Encounter for weight management   Will see if we can get Zepbound  covered since not tolerating Wegovy . Will need ot do appeal.      Relevant Medications   tirzepatide  (ZEPBOUND ) 2.5 MG/0.5ML injection vial   Depression with anxiety   Doing well with Buspar .        Relevant Orders   CMP14+EGFR   CBC with Differential/Platelet   TSH   VITAMIN D  25 Hydroxy (Vit-D Deficiency, Fractures)   Other Visit Diagnoses       Other fatigue       Relevant Orders   CMP14+EGFR   CBC with Differential/Platelet   TSH   VITAMIN D  25 Hydroxy (Vit-D Deficiency, Fractures)       Assessment and Plan Assessment & Plan Obesity with intolerance to GLP-1 receptor agonist therapy Intolerant to Wegovy  due to migraines and gastrointestinal issues. Better response with Zepbound , losing eight pounds in two weeks without adverse effects. Insurance coverage for Zepbound  is problematic. - Attempt to appeal insurance for Zepbound  coverage, citing intolerance to Wegovy . - Consider retrying for Zepbound  coverage in January if the appeal is unsuccessful.  Migraine without aura Migraines worsened with Wegovy , described as ice pick headaches. No migraines with Zepbound .  Severe obstructive sleep apnea Using CPAP therapy but reports difficulty falling asleep and feeling unrefreshed. CPAP effectiveness needs evaluation. - Check CPAP machine data for effectiveness and potential adjustments. - Consider requesting a download from [facility] to assess CPAP performance.  Fatigue Significant fatigue with difficulty falling asleep and feeling unrefreshed. Possible causes include sleep apnea, thyroid  dysfunction, anemia, or electrolyte imbalance. - Order blood work including CBC, CMP, and thyroid  function tests to evaluate for anemia, thyroid  dysfunction, or electrolyte imbalance.  Gastroesophageal reflux disease Taking reflux medication regularly with no issues reported.  Depression with  anxiety Mood stable but fatigue contributes to edginess at work. Continues on buspirone .   Return in about 6 months (around 04/05/2024).    Dorothyann Byars, MD

## 2023-10-04 NOTE — Telephone Encounter (Signed)
 Lease initiate appeal and prior authorization for Zepbound .  She did try the Wegovy  and it caused severe GI upset nausea, triggered a migraine for days and has caused some hair loss.  She actually did much better with the Zepbound .

## 2023-10-04 NOTE — Assessment & Plan Note (Signed)
Doing well with Buspar.

## 2023-10-04 NOTE — Telephone Encounter (Signed)
 Information has been sent to clinical pharmacist for appeals review. It may take 5-7 days to prepare the necessary documentation to request the appeal from the insurance.

## 2023-10-04 NOTE — Assessment & Plan Note (Signed)
 Will see if we can get Zepbound  covered since not tolerating Wegovy . Will need ot do appeal.

## 2023-10-04 NOTE — Assessment & Plan Note (Signed)
 Will try to gete download for CPAP from [facility].

## 2023-10-04 NOTE — Addendum Note (Signed)
 Addended by: BONNY JON DEL on: 10/04/2023 11:33 AM   Modules accepted: Orders

## 2023-10-04 NOTE — Assessment & Plan Note (Addendum)
 Stable but did flare with the Wegovy .  Uses maxalt  for rescue.

## 2023-10-05 ENCOUNTER — Telehealth: Payer: Self-pay | Admitting: Pharmacist

## 2023-10-05 ENCOUNTER — Other Ambulatory Visit (HOSPITAL_COMMUNITY): Payer: Self-pay

## 2023-10-05 ENCOUNTER — Ambulatory Visit: Payer: Self-pay | Admitting: Family Medicine

## 2023-10-05 LAB — CMP14+EGFR
ALT: 24 IU/L (ref 0–32)
AST: 23 IU/L (ref 0–40)
Albumin: 4.3 g/dL (ref 3.9–4.9)
Alkaline Phosphatase: 68 IU/L (ref 44–121)
BUN/Creatinine Ratio: 15 (ref 9–23)
BUN: 13 mg/dL (ref 6–24)
Bilirubin Total: 0.3 mg/dL (ref 0.0–1.2)
CO2: 23 mmol/L (ref 20–29)
Calcium: 9.5 mg/dL (ref 8.7–10.2)
Chloride: 102 mmol/L (ref 96–106)
Creatinine, Ser: 0.86 mg/dL (ref 0.57–1.00)
Globulin, Total: 2.4 g/dL (ref 1.5–4.5)
Glucose: 88 mg/dL (ref 70–99)
Potassium: 4.5 mmol/L (ref 3.5–5.2)
Sodium: 142 mmol/L (ref 134–144)
Total Protein: 6.7 g/dL (ref 6.0–8.5)
eGFR: 82 mL/min/1.73 (ref >59)

## 2023-10-05 LAB — CBC WITH DIFFERENTIAL/PLATELET
Basophils Absolute: 0 x10E3/uL (ref 0.0–0.2)
Basos: 1 %
EOS (ABSOLUTE): 0.2 x10E3/uL (ref 0.0–0.4)
Eos: 3 %
Hematocrit: 40.9 % (ref 34.0–46.6)
Hemoglobin: 13.1 g/dL (ref 11.1–15.9)
Immature Grans (Abs): 0 x10E3/uL (ref 0.0–0.1)
Immature Granulocytes: 0 %
Lymphocytes Absolute: 1.7 x10E3/uL (ref 0.7–3.1)
Lymphs: 27 %
MCH: 29.8 pg (ref 26.6–33.0)
MCHC: 32 g/dL (ref 31.5–35.7)
MCV: 93 fL (ref 79–97)
Monocytes Absolute: 0.5 x10E3/uL (ref 0.1–0.9)
Monocytes: 8 %
Neutrophils Absolute: 3.8 x10E3/uL (ref 1.4–7.0)
Neutrophils: 61 %
Platelets: 205 x10E3/uL (ref 150–450)
RBC: 4.39 x10E6/uL (ref 3.77–5.28)
RDW: 12.7 % (ref 11.7–15.4)
WBC: 6.2 x10E3/uL (ref 3.4–10.8)

## 2023-10-05 LAB — VITAMIN D 25 HYDROXY (VIT D DEFICIENCY, FRACTURES): Vit D, 25-Hydroxy: 45.6 ng/mL (ref 30.0–100.0)

## 2023-10-05 LAB — TSH: TSH: 1.7 u[IU]/mL (ref 0.450–4.500)

## 2023-10-05 NOTE — Telephone Encounter (Signed)
 Appeal has been submitted for Zepbound . Will advise when response is received or follow up in 1 week. Please be advised that most companies may take 30 days to make a decision. Appeal letter and supporting documentation have been faxed to [phone] on 10/05/2023 @12 :52 pm.  Thank you, Devere Pandy, PharmD Clinical Pharmacist  [place]  Direct Dial: [phone]

## 2023-10-05 NOTE — Progress Notes (Signed)
 Hi Lagina all your labs look good.

## 2023-10-05 NOTE — Telephone Encounter (Signed)
 Compliance report requested from [facility].

## 2023-10-06 ENCOUNTER — Other Ambulatory Visit: Payer: Self-pay | Admitting: Family Medicine

## 2023-10-06 DIAGNOSIS — E66812 Obesity, class 2: Secondary | ICD-10-CM

## 2023-10-06 DIAGNOSIS — Z7689 Persons encountering health services in other specified circumstances: Secondary | ICD-10-CM

## 2023-10-13 ENCOUNTER — Other Ambulatory Visit (HOSPITAL_COMMUNITY): Payer: Self-pay

## 2023-10-14 NOTE — Telephone Encounter (Signed)
 See allergy list. She is intolerant to Wegovy . Has already tried. Can we resbumit

## 2023-10-14 NOTE — Telephone Encounter (Signed)
 The appeal for Zepbound  has been denied by the insurance for the following reason:    The full denial letter can be found under the media tab.

## 2023-10-15 ENCOUNTER — Other Ambulatory Visit (HOSPITAL_COMMUNITY): Payer: Self-pay

## 2023-10-15 ENCOUNTER — Telehealth: Payer: Self-pay | Admitting: Pharmacist

## 2023-10-15 ENCOUNTER — Encounter: Payer: Self-pay | Admitting: Family Medicine

## 2023-10-15 NOTE — Telephone Encounter (Signed)
 Second level appeal has been submitted for Zepbound . Will advise when response is received, please be advised that most companies may take 30 days to make a decision. Appeal letter and supporting documentation have been faxed to [phone] on 10/15/2023 @1 :02.  Thank you, Devere Pandy, PharmD Clinical Pharmacist  [place]  Direct Dial: [phone]

## 2023-10-25 ENCOUNTER — Encounter: Payer: Self-pay | Admitting: Family Medicine

## 2023-10-26 ENCOUNTER — Other Ambulatory Visit (HOSPITAL_COMMUNITY): Payer: Self-pay

## 2023-10-27 ENCOUNTER — Other Ambulatory Visit (HOSPITAL_COMMUNITY): Payer: Self-pay

## 2023-10-27 NOTE — Telephone Encounter (Signed)
 Insurance has approved MOUNJARO  (tirzepatide ) for the patient effective through 06/23/2024.  Certain insurances are approving the use of Mounjaro  in patients without type 2 diabetes for weight loss management. Please send in a script for Mounjaro  and it will be covered by the patient's insurance.     Thank you, Devere Pandy, PharmD Clinical Pharmacist  [place]  Direct Dial: [phone]

## 2023-11-01 DIAGNOSIS — M1711 Unilateral primary osteoarthritis, right knee: Secondary | ICD-10-CM | POA: Diagnosis not present

## 2023-11-04 ENCOUNTER — Telehealth: Admitting: Physician Assistant

## 2023-11-04 DIAGNOSIS — J329 Chronic sinusitis, unspecified: Secondary | ICD-10-CM

## 2023-11-04 MED ORDER — AMOXICILLIN-POT CLAVULANATE 875-125 MG PO TABS: 1.0000 | ORAL_TABLET | Freq: Two times a day (BID) | ORAL | 0 refills | Status: AC

## 2023-11-04 NOTE — Patient Instructions (Signed)
 Tonya Phillips, thank you for joining Tonya Shuck, Tonya Phillips for today's virtual visit.  While this provider is not your primary care provider (PCP), if your PCP is located in our provider database this encounter information will be shared with them immediately following your visit.   A [facility] MyChart account gives you access to today's visit and all your visits, tests, and labs performed at [facility]  click here if you don't have a [place] MyChart account or go to [email or web address]  Consent: (Patient) Tonya Phillips provided verbal consent for this virtual visit at the beginning of the encounter.  Current Medications:  Current Outpatient Medications:    albuterol  (VENTOLIN  HFA) 108 (90 Base) MCG/ACT inhaler, Inhale 2 puffs into the lungs every 6 (six) hours as needed for wheezing or shortness of breath., Disp: 8 g, Rfl: 0   AMBULATORY NON FORMULARY MEDICATION, Medication Name: CPAP, humidifier and supplies set to 12 cm water  pressure. . Dx OSA. [facility]., Disp: 1 vial, Rfl: 0   atorvastatin  (LIPITOR) 80 MG tablet, Take by mouth., Disp: , Rfl:    Azelastine -Fluticasone  137-50 MCG/ACT SUSP, Place 1 spray into the nose every 12 (twelve) hours., Disp: 23 g, Rfl: 1   Biotin 2500 MCG CAPS, Take by mouth., Disp: , Rfl:    busPIRone  (BUSPAR ) 7.5 MG tablet, TAKE 1 TABLET BY MOUTH TWICE A DAY, Disp: 180 tablet, Rfl: 1   IRON-VITAMIN C PO, Take by mouth., Disp: , Rfl:    meloxicam (MOBIC) 15 MG tablet, Take 15 mg by mouth as needed., Disp: , Rfl:    Multiple Vitamin tablet, Take 1 tablet by mouth daily., Disp: , Rfl:    RABEprazole (ACIPHEX) 20 MG tablet, Take 20 mg by mouth daily., Disp: , Rfl:    rizatriptan  (MAXALT ) 10 MG tablet, Take 1 tablet (10 mg total) by mouth as needed for migraine. APPT FOR REFILLS, Disp: 10 tablet, Rfl: 5   tirzepatide  (ZEPBOUND ) 2.5 MG/0.5ML Pen, Inject 2.5 mg into the skin once a week., Disp: 2 mL, Rfl: 0   venlafaxine  XR (EFFEXOR -XR) 150 MG 24 hr  capsule, Take 1 capsule (150 mg total) by mouth daily with breakfast. Needs appointment., Disp: 90 capsule, Rfl: 0   vitamin C (ASCORBIC ACID) 250 MG tablet, Take 250 mg by mouth daily., Disp: , Rfl:    Medications ordered in this encounter:  No orders of the defined types were placed in this encounter.    *If you need refills on other medications prior to your next appointment, please contact your pharmacy*  Follow-Up: Call back or seek an in-person evaluation if the symptoms worsen or if the condition fails to improve as anticipated.  [facility] [phone]  Other Instructions Please report to the nearest Emergency room with any worsening symptoms. Follow up with primary care provider (PCP) in 2 -3 days.    If you have been instructed to have an in-person evaluation today at a local Urgent Care facility, please use the link below. It will take you to a list of all of our available [facility], including address, phone number and hours of operation. Please do not delay care.  [facility]  If you or a family member do not have a primary care provider, use the link below to schedule a visit and establish care. When you choose a [place] primary care physician or advanced practice provider, you gain a long-term partner in health. Find a Primary Care Provider  Learn more about [facility]'s in-office and virtual care options:  - Get Care Now

## 2023-11-04 NOTE — Progress Notes (Signed)
 Virtual Visit Consent   Tonya Phillips, you are scheduled for a virtual visit with a [facility] provider today. Just as with appointments in the office, your consent must be obtained to participate. Your consent will be active for this visit and any virtual visit you may have with one of our providers in the next 365 days. If you have a MyChart account, a copy of this consent can be sent to you electronically.  As this is a virtual visit, video technology does not allow for your provider to perform a traditional examination. This may limit your provider's ability to fully assess your condition. If your provider identifies any concerns that need to be evaluated in person or the need to arrange testing (such as labs, EKG, etc.), we will make arrangements to do so. Although advances in technology are sophisticated, we cannot ensure that it will always work on either your end or our end. If the connection with a video visit is poor, the visit may have to be switched to a telephone visit. With either a video or telephone visit, we are not always able to ensure that we have a secure connection.  By engaging in this virtual visit, you consent to the provision of healthcare and authorize for your insurance to be billed (if applicable) for the services provided during this visit. Depending on your insurance coverage, you may receive a charge related to this service.  I need to obtain your verbal consent now. Are you willing to proceed with your visit today? CANDACE BEGUE has provided verbal consent on 11/04/2023 for a virtual visit (video or telephone). Teena Shuck, [place]  Date: 11/04/2023 10:54 AM   Virtual Visit via Video Note   I, Teena Shuck, connected with  TALIANA MERSEREAU  ([id number], 1970/11/28) on 11/04/23 at 10:45 AM EDT by a video-enabled telemedicine application and verified that I am speaking with the correct person using two identifiers.  Location: Patient: Virtual Visit Location Patient:  Home Provider: Virtual Visit Location Provider: Home Office   I discussed the limitations of evaluation and management by telemedicine and the availability of in person appointments. The patient expressed understanding and agreed to proceed.    History of Present Illness: Tonya Phillips is a 53 y.o. who identifies as a female who was assigned female at birth, and is being seen today for sinus pain,.  HPI: Sinus Problem The current episode started in the past 7 days. The problem is unchanged. There has been no fever. She is experiencing no pain. Associated symptoms include congestion and sinus pressure. The treatment provided mild relief.    Problems:  Patient Active Problem List   Diagnosis Date Noted   Encounter for weight management 03/01/2023   History of sleeve gastrectomy 03/09/2022   H/O: hysterectomy 05/21/2021   Ovarian cyst 05/09/2021   Migraine without aura and without status migrainosus, not intractable 10/13/2019   NAFLD (nonalcoholic fatty liver disease) 88/93/7981   Obesity, Class II, BMI 35-39.9 12/22/2016   Gastroesophageal reflux disease 11/16/2016   HLD (hyperlipidemia) 09/22/2016   Depression with anxiety 11/22/2015   Wrist pain, right 02/08/2015   Right ankle pain 10/02/2014   Carpal tunnel syndrome 10/02/2014   Chronic rhinitis 09/19/2014   Osteoarthritis of right knee 09/19/2014   PCOS (polycystic ovarian syndrome) 09/04/2014   OSA on CPAP 09/04/2014    Allergies:  Allergies  Allergen Reactions   Semaglutide  Other (See Comments)    Pt reported that Wegovy  caused her to experience hair  loss and nausea,migraine   Medications:  Current Outpatient Medications:    albuterol  (VENTOLIN  HFA) 108 (90 Base) MCG/ACT inhaler, Inhale 2 puffs into the lungs every 6 (six) hours as needed for wheezing or shortness of breath., Disp: 8 g, Rfl: 0   AMBULATORY NON FORMULARY MEDICATION, Medication Name: CPAP, humidifier and supplies set to 12 cm water  pressure. . Dx OSA.  [facility]., Disp: 1 vial, Rfl: 0   atorvastatin  (LIPITOR) 80 MG tablet, Take by mouth., Disp: , Rfl:    Azelastine -Fluticasone  137-50 MCG/ACT SUSP, Place 1 spray into the nose every 12 (twelve) hours., Disp: 23 g, Rfl: 1   Biotin 2500 MCG CAPS, Take by mouth., Disp: , Rfl:    busPIRone  (BUSPAR ) 7.5 MG tablet, TAKE 1 TABLET BY MOUTH TWICE A DAY, Disp: 180 tablet, Rfl: 1   IRON-VITAMIN C PO, Take by mouth., Disp: , Rfl:    meloxicam (MOBIC) 15 MG tablet, Take 15 mg by mouth as needed., Disp: , Rfl:    Multiple Vitamin tablet, Take 1 tablet by mouth daily., Disp: , Rfl:    RABEprazole (ACIPHEX) 20 MG tablet, Take 20 mg by mouth daily., Disp: , Rfl:    rizatriptan  (MAXALT ) 10 MG tablet, Take 1 tablet (10 mg total) by mouth as needed for migraine. APPT FOR REFILLS, Disp: 10 tablet, Rfl: 5   tirzepatide  (ZEPBOUND ) 2.5 MG/0.5ML Pen, Inject 2.5 mg into the skin once a week., Disp: 2 mL, Rfl: 0   venlafaxine  XR (EFFEXOR -XR) 150 MG 24 hr capsule, Take 1 capsule (150 mg total) by mouth daily with breakfast. Needs appointment., Disp: 90 capsule, Rfl: 0   vitamin C (ASCORBIC ACID) 250 MG tablet, Take 250 mg by mouth daily., Disp: , Rfl:   Observations/Objective: Patient is well-developed, well-nourished in no acute distress.  Resting comfortably  at home.  Head is normocephalic, atraumatic.  No labored breathing.  Speech is clear and coherent with logical content.  Patient is alert and oriented at baseline.    Assessment and Plan: 1. Recurrent sinus infections (Primary) Presentation consistent with sinusitis.  No evidence of other bacterial infections including pneumonia, meningitis, pharyngitis, otitis media.  Discussed that this fits picture of viral vs bacterial sinusitis and that due to type and duration of symptoms and exam findings, we will treat as bacterial sinusitis.  Antibiotics prescribed. Advised to continue ibuprofen  and Tylenol  at home. Patient is to follow up with primary physician if  having continued symptoms.   Advised patient on supportive therapies, including using a cool-mist vaporizer/humidifier/steam from hot showers, OTC throat lozenges, advancement of fluids as tolerated, nasal saline sprays, rest, OTC acetaminophen  or ibuprofen  for pain control, frequent handwashing.   Follow Up Instructions: I discussed the assessment and treatment plan with the patient. The patient was provided an opportunity to ask questions and all were answered. The patient agreed with the plan and demonstrated an understanding of the instructions.  A copy of instructions were sent to the patient via MyChart unless otherwise noted below.    The patient was advised to call back or seek an in-person evaluation if the symptoms worsen or if the condition fails to improve as anticipated.    Teena Shuck, PA-C

## 2023-11-05 NOTE — Telephone Encounter (Signed)
 She is not diabetic. Thank you.

## 2023-11-11 DIAGNOSIS — L649 Androgenic alopecia, unspecified: Secondary | ICD-10-CM | POA: Diagnosis not present

## 2023-11-16 NOTE — Progress Notes (Signed)
 Tonya Phillips                                          MRN: [id number]   11/16/2023   The VBCI Quality Team Specialist reviewed this patient medical record for the purposes of chart review for care gap closure. The following were reviewed: chart review for care gap closure-kidney health evaluation for diabetes:eGFR  and uACR.    VBCI Quality Team

## 2023-11-19 ENCOUNTER — Other Ambulatory Visit: Payer: Self-pay | Admitting: Family Medicine

## 2023-11-19 DIAGNOSIS — F418 Other specified anxiety disorders: Secondary | ICD-10-CM

## 2023-11-23 ENCOUNTER — Encounter: Payer: Self-pay | Admitting: Family Medicine

## 2023-11-29 ENCOUNTER — Other Ambulatory Visit (HOSPITAL_COMMUNITY): Payer: Self-pay

## 2023-12-03 ENCOUNTER — Other Ambulatory Visit (HOSPITAL_COMMUNITY): Payer: Self-pay

## 2023-12-03 ENCOUNTER — Encounter: Payer: Self-pay | Admitting: Family Medicine

## 2023-12-03 ENCOUNTER — Telehealth: Payer: Self-pay

## 2023-12-03 NOTE — Telephone Encounter (Signed)
 Pharmacy Patient Advocate Encounter   Received notification from CoverMyMeds that prior authorization for Zepbound  2.5mg /0.57ml is due for renewal.   Insurance verification completed.   The patient is insured through [facility].  Action: Medication has been discontinued. Archived Key: [id number]  There is an approval letter for Mounjaro  that is good from 10/25/23 to 06/23/24.   Can you please send in a new order for Mounjaro  to the pharmacy for the patient?

## 2023-12-03 NOTE — Telephone Encounter (Signed)
 Left message for a return call

## 2023-12-03 NOTE — Telephone Encounter (Signed)
 See if she is ready to go up to the 5mg  dose on the zepbound  for us  to send ot pharmacy

## 2023-12-06 NOTE — Telephone Encounter (Signed)
 See MyChart message

## 2023-12-08 MED ORDER — TIRZEPATIDE 2.5 MG/0.5ML ~~LOC~~ SOAJ: 2.5000 mg | SUBCUTANEOUS | 0 refills | Status: DC

## 2023-12-08 NOTE — Telephone Encounter (Signed)
Meds ordered this encounter  Medications   tirzepatide (MOUNJARO) 2.5 MG/0.5ML Pen    Sig: Inject 2.5 mg into the skin once a week.    Dispense:  2 mL    Refill:  0

## 2024-01-12 ENCOUNTER — Encounter: Payer: Self-pay | Admitting: Family Medicine

## 2024-02-18 ENCOUNTER — Other Ambulatory Visit: Payer: Self-pay | Admitting: Family Medicine

## 2024-02-18 ENCOUNTER — Encounter: Payer: Self-pay | Admitting: Family Medicine

## 2024-02-18 DIAGNOSIS — F418 Other specified anxiety disorders: Secondary | ICD-10-CM

## 2024-02-22 ENCOUNTER — Encounter: Payer: Self-pay | Admitting: Family Medicine

## 2024-02-22 DIAGNOSIS — E282 Polycystic ovarian syndrome: Secondary | ICD-10-CM

## 2024-02-22 DIAGNOSIS — K760 Fatty (change of) liver, not elsewhere classified: Secondary | ICD-10-CM

## 2024-02-22 DIAGNOSIS — G4733 Obstructive sleep apnea (adult) (pediatric): Secondary | ICD-10-CM

## 2024-02-22 DIAGNOSIS — E66812 Obesity, class 2: Secondary | ICD-10-CM

## 2024-02-23 MED ORDER — TIRZEPATIDE-WEIGHT MANAGEMENT 2.5 MG/0.5ML ~~LOC~~ SOLN: 2.5000 mg | SUBCUTANEOUS | 0 refills | Status: AC

## 2024-02-23 NOTE — Telephone Encounter (Signed)
 Meds ordered this encounter  Medications   tirzepatide (ZEPBOUND) 2.5 MG/0.5ML injection vial    Sig: Inject 2.5 mg into the skin once a week.    Dispense:  2 mL    Refill:  0

## 2024-03-17 ENCOUNTER — Telehealth: Payer: Self-pay | Admitting: Family Medicine

## 2024-03-17 DIAGNOSIS — Z1231 Encounter for screening mammogram for malignant neoplasm of breast: Secondary | ICD-10-CM

## 2024-03-17 NOTE — Telephone Encounter (Signed)
 LVM asking pt to either rtn call or send my chart about mammogram and flu shot.

## 2024-03-17 NOTE — Telephone Encounter (Signed)
 Call pt: very overdue for mammo. Order placed unless she goes elsewhere for exam

## 2024-03-22 NOTE — Telephone Encounter (Signed)
 Documented that patient declined the flu shot -

## 2024-04-05 ENCOUNTER — Ambulatory Visit: Admitting: Family Medicine
